# Patient Record
Sex: Female | Born: 1965 | Race: White | Hispanic: No | Marital: Married | Smoking: Former smoker
Health system: Southern US, Community
[De-identification: ages and names within clinical notes are randomized; demographics above are authoritative.]

## PROBLEM LIST (undated history)

## (undated) DIAGNOSIS — B019 Varicella without complication: Secondary | ICD-10-CM

## (undated) DIAGNOSIS — R51 Headache: Secondary | ICD-10-CM

## (undated) DIAGNOSIS — J45909 Unspecified asthma, uncomplicated: Secondary | ICD-10-CM

## (undated) DIAGNOSIS — F32A Depression, unspecified: Secondary | ICD-10-CM

## (undated) DIAGNOSIS — R7611 Nonspecific reaction to tuberculin skin test without active tuberculosis: Secondary | ICD-10-CM

## (undated) DIAGNOSIS — F419 Anxiety disorder, unspecified: Secondary | ICD-10-CM

## (undated) DIAGNOSIS — M1990 Unspecified osteoarthritis, unspecified site: Secondary | ICD-10-CM

## (undated) DIAGNOSIS — Z8489 Family history of other specified conditions: Secondary | ICD-10-CM

## (undated) DIAGNOSIS — J301 Allergic rhinitis due to pollen: Secondary | ICD-10-CM

## (undated) DIAGNOSIS — R519 Headache, unspecified: Secondary | ICD-10-CM

## (undated) HISTORY — DX: Varicella without complication: B01.9

## (undated) HISTORY — DX: Headache: R51

## (undated) HISTORY — PX: REPLACEMENT TOTAL KNEE: SUR1224

## (undated) HISTORY — DX: Allergic rhinitis due to pollen: J30.1

## (undated) HISTORY — DX: Unspecified asthma, uncomplicated: J45.909

## (undated) HISTORY — PX: TOTAL HIP ARTHROPLASTY: SHX124

## (undated) HISTORY — DX: Headache, unspecified: R51.9

---

## 2010-01-08 LAB — HM PAP SMEAR: HM PAP: NORMAL

## 2010-08-02 LAB — HM MAMMOGRAPHY: HM Mammogram: NEGATIVE

## 2014-10-21 ENCOUNTER — Encounter (INDEPENDENT_AMBULATORY_CARE_PROVIDER_SITE_OTHER): Payer: Self-pay

## 2014-10-21 ENCOUNTER — Ambulatory Visit (INDEPENDENT_AMBULATORY_CARE_PROVIDER_SITE_OTHER): Payer: BC Managed Care – PPO | Admitting: Nurse Practitioner

## 2014-10-21 ENCOUNTER — Encounter: Payer: Self-pay | Admitting: Nurse Practitioner

## 2014-10-21 VITALS — BP 106/68 | HR 89 | Temp 98.2°F | Resp 14 | Ht 61.0 in | Wt 202.0 lb

## 2014-10-21 DIAGNOSIS — M25561 Pain in right knee: Secondary | ICD-10-CM

## 2014-10-21 DIAGNOSIS — R2242 Localized swelling, mass and lump, left lower limb: Secondary | ICD-10-CM

## 2014-10-21 DIAGNOSIS — Z7689 Persons encountering health services in other specified circumstances: Secondary | ICD-10-CM

## 2014-10-21 DIAGNOSIS — J4520 Mild intermittent asthma, uncomplicated: Secondary | ICD-10-CM | POA: Diagnosis not present

## 2014-10-21 DIAGNOSIS — R519 Headache, unspecified: Secondary | ICD-10-CM

## 2014-10-21 DIAGNOSIS — Z7189 Other specified counseling: Secondary | ICD-10-CM | POA: Diagnosis not present

## 2014-10-21 DIAGNOSIS — G4733 Obstructive sleep apnea (adult) (pediatric): Secondary | ICD-10-CM

## 2014-10-21 DIAGNOSIS — R51 Headache: Secondary | ICD-10-CM

## 2014-10-21 MED ORDER — ALBUTEROL SULFATE HFA 108 (90 BASE) MCG/ACT IN AERS: 2.0000 | INHALATION_SPRAY | Freq: Four times a day (QID) | RESPIRATORY_TRACT | Status: AC | PRN

## 2014-10-21 NOTE — Progress Notes (Signed)
Subjective:    Patient ID: Joyce Bradford, female    DOB: 11-05-1965, 49 y.o.   MRN: [id number]  HPI  Joyce Bradford is a 49 yo female establishing care and CC of left foot hurting.   1) New pt info:  Immunizations- tdap 2014, pna 2015  Mammogram- 2012 normal   Pap- 2014, normal; would like [place] to handle GYN  Eye Exam- 05/16  Dental Exam- Not UTD  2) Chronic Problems-  Asthma- Has rescue inhaler, needs new one   Frequent HA- Depo- provera thought it might have made it worse, not happening but once-twice a month and last 2-3 days, frontal headaches, constant pain, bending is worse, been about 2 months since last episode   3) Acute Problems-  Right knee pain x 2 weeks, iced, motrin, knee brace, Monday felt improved, gained 20 lbs over 3 years   Left side of foot bump- only hurts on lateral side, painful x 1 month   OSA- [facility]- needs supplies; had one sleep study in 2008-2009; done at [facility]. Has different insurance ([employer] state health plan). Fatigued during the day   Review of Systems  Constitutional: Negative for fever, chills, diaphoresis and fatigue.  Respiratory: Positive for wheezing. Negative for chest tightness and shortness of breath.   Cardiovascular: Negative for chest pain, palpitations and leg swelling.  Gastrointestinal: Negative for nausea, vomiting and diarrhea.  Musculoskeletal: Positive for arthralgias and gait problem.  Skin: Negative for rash.  Neurological: Positive for headaches. Negative for dizziness, weakness and numbness.  Psychiatric/Behavioral: The patient is not nervous/anxious.    Past Medical History  Diagnosis Date  . Asthma   . Chicken pox   . Frequent headaches   . Hay fever     History   Social History  . Marital Status: Married    Spouse Name: N/A  . Number of Children: N/A  . Years of Education: N/A   Occupational History  . Not on file.   Social History Main Topics  . Smoking status: Former Smoker    Quit date:  05/08/1997  . Smokeless tobacco: Not on file  . Alcohol Use: 0.0 oz/week    0 Standard drinks or equivalent per week     Comment: Allergy to sulfites in wine; occasional margarita   . Drug Use: No  . Sexual Activity:    Partners: Male     Comment: Husband   Other Topics Concern  . 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 No pertinent past surgical history.  Family History  Problem Relation Age of Onset  . Rheum arthritis Mother   . Hyperlipidemia Father   . Stroke Maternal Grandmother   . Hypertension Maternal Grandfather   . Diabetes Maternal Grandmother   . Cancer Mother     Non Hodgkins Lymphoma  . Mental illness Maternal Grandmother     Not on File  No current outpatient prescriptions on file prior to visit.   No current facility-administered medications on file prior to visit.      Objective:   Physical Exam  Constitutional: She is oriented  to person, place, and time. She appears well-developed and well-nourished. No distress.  BP 106/68 mmHg  Pulse 89  Temp(Src) 98.2 F (36.8 C)  Resp 14  Ht 5\' 1"  (1.549 m)  Wt 202 lb (91.627 kg)  BMI 38.19 kg/m2  SpO2 97%  LMP 10/16/2014   HENT:  Head: Normocephalic and atraumatic.  Right Ear: External ear normal.  Left Ear: External ear normal.  Eyes: EOM are normal. Pupils are equal, round, and reactive to light. Right eye exhibits no discharge. Left eye exhibits no discharge. No scleral icterus.  Neck: Normal range of motion. Neck supple.  Cardiovascular: Normal rate, regular rhythm, normal heart sounds and intact distal pulses.  Exam reveals no gallop and no friction rub.   No  murmur heard. Pulmonary/Chest: Effort normal and breath sounds normal. No respiratory distress. She has no wheezes. She has no rales. She exhibits no tenderness.  Musculoskeletal: Normal range of motion. She exhibits tenderness. She exhibits no edema.  Left foot lateral bump. Will x-ray (thinking back- believe it is left although nurse wrote right).   Neurological: She is alert and oriented to person, place, and time. No cranial nerve deficit. She exhibits normal muscle tone. Coordination normal.  Skin: Skin is warm and dry. No rash noted. She is not diaphoretic.  Psychiatric: She has a normal mood and affect. Her behavior is normal. Judgment and thought content normal.       Assessment & Plan:

## 2014-10-21 NOTE — Patient Instructions (Signed)
Open till 5 pm, order is good a year (please don't wait that long :)   Right knee- 2-3 weeks watch activity, rest ice motrin   Sleep apnea- we will contact [facility] and find out what we need to do from here and obtain records.

## 2014-10-26 DIAGNOSIS — Z7689 Persons encountering health services in other specified circumstances: Secondary | ICD-10-CM | POA: Insufficient documentation

## 2014-10-26 DIAGNOSIS — R2240 Localized swelling, mass and lump, unspecified lower limb: Secondary | ICD-10-CM | POA: Insufficient documentation

## 2014-10-26 DIAGNOSIS — R519 Headache, unspecified: Secondary | ICD-10-CM | POA: Insufficient documentation

## 2014-10-26 DIAGNOSIS — R51 Headache: Secondary | ICD-10-CM

## 2014-10-26 DIAGNOSIS — M25561 Pain in right knee: Secondary | ICD-10-CM | POA: Insufficient documentation

## 2014-10-26 DIAGNOSIS — G4733 Obstructive sleep apnea (adult) (pediatric): Secondary | ICD-10-CM | POA: Insufficient documentation

## 2014-10-26 DIAGNOSIS — J45909 Unspecified asthma, uncomplicated: Secondary | ICD-10-CM | POA: Insufficient documentation

## 2014-10-26 NOTE — Assessment & Plan Note (Signed)
Improved. Discussed diet and exercise for weight loss and gave low GI diet handout. RICE for a few weeks due to injury. Will obtain x-ray if not improved.

## 2014-10-26 NOTE — Assessment & Plan Note (Signed)
Stable. 2 months since last HA. No complaints today. Will follow

## 2014-10-26 NOTE — Assessment & Plan Note (Signed)
Left foot lateral mass. Could be soft tissue edema, x 1 month, Will obtain x-ray

## 2014-10-26 NOTE — Assessment & Plan Note (Signed)
Pt uses [facility]. Awaiting records. She does not have supplies. Last sleep study unsure of date, but approx 2008-2009. Will follow.

## 2014-10-26 NOTE — Assessment & Plan Note (Signed)
Discussed acute and chronic issues. Reviewed health maintenance measures, PFSHx, and immunizations. Obtain records from previous facility.   

## 2014-10-26 NOTE — Assessment & Plan Note (Signed)
Stable. Needs new inhaler to have on hand. Current one is old and out dated.

## 2015-01-19 ENCOUNTER — Other Ambulatory Visit: Payer: Self-pay | Admitting: Obstetrics and Gynecology

## 2015-01-19 DIAGNOSIS — Z1231 Encounter for screening mammogram for malignant neoplasm of breast: Secondary | ICD-10-CM

## 2015-01-28 ENCOUNTER — Ambulatory Visit
Admission: RE | Admit: 2015-01-28 | Discharge: 2015-01-28 | Disposition: A | Discharge status: Home / Self Care | Payer: BC Managed Care – PPO | Source: Ambulatory Visit | Attending: Obstetrics and Gynecology | Admitting: Obstetrics and Gynecology

## 2015-01-28 DIAGNOSIS — Z1231 Encounter for screening mammogram for malignant neoplasm of breast: Secondary | ICD-10-CM | POA: Diagnosis not present

## 2016-03-16 ENCOUNTER — Other Ambulatory Visit: Payer: Self-pay | Admitting: Internal Medicine

## 2016-03-16 DIAGNOSIS — Z1231 Encounter for screening mammogram for malignant neoplasm of breast: Secondary | ICD-10-CM

## 2016-03-21 ENCOUNTER — Encounter: Payer: Self-pay | Admitting: Radiology

## 2016-03-21 ENCOUNTER — Ambulatory Visit
Admission: RE | Admit: 2016-03-21 | Discharge: 2016-03-21 | Disposition: A | Discharge status: Home / Self Care | Payer: BC Managed Care – PPO | Source: Ambulatory Visit | Attending: Internal Medicine | Admitting: Internal Medicine

## 2016-03-21 DIAGNOSIS — Z1231 Encounter for screening mammogram for malignant neoplasm of breast: Secondary | ICD-10-CM | POA: Diagnosis not present

## 2017-06-12 ENCOUNTER — Other Ambulatory Visit: Payer: Self-pay | Admitting: Internal Medicine

## 2017-06-12 DIAGNOSIS — Z1239 Encounter for other screening for malignant neoplasm of breast: Secondary | ICD-10-CM

## 2020-09-30 ENCOUNTER — Other Ambulatory Visit: Payer: Self-pay | Admitting: Physician Assistant

## 2020-09-30 DIAGNOSIS — Z1231 Encounter for screening mammogram for malignant neoplasm of breast: Secondary | ICD-10-CM

## 2021-06-09 HISTORY — PX: TOTAL HIP ARTHROPLASTY: SHX124

## 2021-10-05 ENCOUNTER — Other Ambulatory Visit: Payer: Self-pay | Admitting: Physician Assistant

## 2021-10-05 DIAGNOSIS — Z1231 Encounter for screening mammogram for malignant neoplasm of breast: Secondary | ICD-10-CM

## 2021-10-07 ENCOUNTER — Ambulatory Visit
Admission: RE | Admit: 2021-10-07 | Discharge: 2021-10-07 | Disposition: A | Discharge status: Home / Self Care | Payer: BC Managed Care – PPO | Source: Ambulatory Visit | Attending: Physician Assistant | Admitting: Physician Assistant

## 2021-10-07 DIAGNOSIS — Z1231 Encounter for screening mammogram for malignant neoplasm of breast: Secondary | ICD-10-CM | POA: Diagnosis not present

## 2021-10-17 ENCOUNTER — Encounter (HOSPITAL_COMMUNITY): Payer: Self-pay | Admitting: Orthopedic Surgery

## 2021-10-17 ENCOUNTER — Other Ambulatory Visit: Payer: Self-pay

## 2021-10-17 NOTE — H&P (Signed)
TOTAL HIP REVISION ADMISSION H&P  Patient is admitted for right revision total hip arthroplasty.  Subjective:  Chief Complaint: right hip pain  HPI: Joyce Bradford, 56 y.o. female, who had undergone right total hip arthroplasty with Dr. Dorthula Nettles 06-09-21.  She had done well postoperatively and had an uneventful postoperative course.  She presented to clinic today to see Dr. Dion Saucier.  She had proximately 2 weeks of atraumatic right thigh and hip pain.  She had no problems with wound healing postoperatively.  She denies any recent illnesses.  She denies any recent trauma or injuries.  She has previously had both knees replaced by Dr. Dion Saucier which are doing well without any issues.  X-rays of the right hip today demonstrated a significant subluxation of the right femoral head and the acetabular component.  Both acetabular shell and femoral head appear well fixed.  Given concern for the significant subluxation, Dr. Dion Saucier discussed revision of arthroplasty with the patient. There is no current active infection.  Patient Active Problem List   Diagnosis Date Noted   Encounter to establish care 10/26/2014   Foot mass 10/26/2014   Right knee pain 10/26/2014   Asthma, chronic 10/26/2014   Frequent headaches 10/26/2014   OSA (obstructive sleep apnea) 10/26/2014   Past Medical History:  Diagnosis Date   Anxiety    Arthritis    Asthma    Chicken pox    Depression    Family history of adverse reaction to anesthesia    Mom hard time waking   Frequent headaches    Hay fever    Positive PPD     Past Surgical History:  Procedure Laterality Date   CESAREAN SECTION     REPLACEMENT TOTAL KNEE Bilateral    TOTAL HIP ARTHROPLASTY      No current facility-administered medications for this encounter.   Current Outpatient Medications  Medication Sig Dispense Refill Last Dose   b complex vitamins capsule Take 1 capsule by mouth daily.      etodolac (LODINE) 400 MG tablet Take 400 mg by mouth 2  (two) times daily.      traMADol (ULTRAM) 50 MG tablet Take 50 mg by mouth 2 (two) times daily.      VITAMIN D, CHOLECALCIFEROL, PO Take by mouth.      albuterol (PROVENTIL HFA;VENTOLIN HFA) 108 (90 BASE) MCG/ACT inhaler Inhale 2 puffs into the lungs every 6 (six) hours as needed for wheezing or shortness of breath. 1 Inhaler 2    Not on File  Social History   Tobacco Use   Smoking status: Former    Types: Cigarettes    Quit date: 05/08/1997    Years since quitting: 24.4   Smokeless tobacco: Not on file  Substance Use Topics   Alcohol use: Yes    Alcohol/week: 0.0 standard drinks of alcohol    Comment: Allergy to sulfites in wine; occasional margarita     Family History  Problem Relation Age of Onset   Rheum arthritis Mother    Cancer Mother        Non Hodgkins Lymphoma   Hyperlipidemia Father    Stroke Maternal Grandmother    Diabetes Maternal Grandmother    Mental illness Maternal Grandmother    Hypertension Maternal Grandfather       Review of Systems  Objective:  Physical Exam  Vital signs in last 24 hours: Weight:  [83 kg] 83 kg (06/12 1143)   Labs:   Estimated body mass index is 33.47 kg/m as  calculated from the following:   Height as of this encounter: 5\' 2"  (1.575 m).   Weight as of this encounter: 83 kg.  Imaging Review:  Plain radiographs reviewed from clinic demonstrate subluxated right femoral head total hip arthroplasty.  Femoral and acetabular components appear to be well fixed.  Acetabular component appears anteverted but relatively vertical.    Assessment/Plan: Right total hip arthroplasty instability  Discussed with the patient over the phone that she has subluxation of her total hip arthroplasty.  She appears to have a well fixed well-positioned femoral component, but acetabular component appears to be relatively vertical, and given the patient's frank instability revision surgery is indicated.  Discussed with the patient intraoperative  assessment to assess stability and component fixation..  Cultures to be sent to rule out infection.  Discussed plan A which would be acetabular component revision only.  The plan would be to extract the patient's current acetabular component and reposition a new acetabular shell to improve the patient's stability.  Discussed that pending the fixation of the acetabular shell and her remaining bone may require a period of partial weightbearing postoperatively.  We will also have offset, lipped liners, dual mobility liners available as well to be utilized depending on the patient's intraoperative stability assessment.  MRI component will plan to be retained unless concern regarding femoral component version or challenges with exposure with the femoral component left in.  Discussed increased risk of infection with revision surgery.  Plan for extended antibiotics postoperatively.  Plan for incisional wound VAC and nylon sutures to facilitate wound healing.   The additional risks and benefits of total hip arthroplasty were presented and reviewed. The risks due to aseptic loosening, infection, stiffness, dislocation/subluxation,  thromboembolic complications and other imponderables were discussed.  The patient acknowledged the explanation, agreed to proceed with the plan and consent was signed. Patient is being admitted for inpatient treatment for surgery, pain control, PT, OT, prophylactic antibiotics, VTE prophylaxis, progressive ambulation and ADL's and discharge planning.  , MD Orthopaedic Surgery

## 2021-10-17 NOTE — Progress Notes (Addendum)
COVID Vaccine Completed:  Yes  Date of COVID positive in last 90 days:  No  PCP - Maurine Minister at [facility] Cardiologist - N/A  Chest x-ray - N/A EKG - at PCP Stress Test - N/A ECHO - N/A Cardiac Cath - N/A Pacemaker/ICD device last checked: Spinal Cord Stimulator:  Bowel Prep - N/A  Sleep Study - Yes, +sleep apnea CPAP - Yes  Fasting Blood Sugar - N/A Checks Blood Sugar _____ times a day  Blood Thinner Instructions: N/A Aspirin Instructions: Last Dose:  Activity level:   Can go up a flight of stairs and perform activities of daily living without stopping and without symptoms of chest pain or shortness of breath.  Some limitations due to hip pain  Anesthesia review: N/A  Patient denies shortness of breath, fever, cough and chest pain at PAT appointment  Patient verbalized understanding of instructions that were given to them at the PAT appointment. Patient was also instructed that they will need to review over the PAT instructions again at home before surgery.

## 2021-10-18 ENCOUNTER — Inpatient Hospital Stay (HOSPITAL_COMMUNITY): Payer: BC Managed Care – PPO

## 2021-10-18 ENCOUNTER — Encounter (HOSPITAL_COMMUNITY): Payer: Self-pay | Admitting: Orthopedic Surgery

## 2021-10-18 ENCOUNTER — Ambulatory Visit (HOSPITAL_COMMUNITY): Payer: BC Managed Care – PPO | Admitting: Certified Registered Nurse Anesthetist

## 2021-10-18 ENCOUNTER — Ambulatory Visit (HOSPITAL_COMMUNITY): Payer: BC Managed Care – PPO

## 2021-10-18 ENCOUNTER — Other Ambulatory Visit: Payer: Self-pay

## 2021-10-18 ENCOUNTER — Observation Stay (HOSPITAL_COMMUNITY)
Admission: RE | Admit: 2021-10-18 | Discharge: 2021-10-20 | Disposition: A | Discharge status: Home / Self Care | Payer: BC Managed Care – PPO | Attending: Orthopedic Surgery | Admitting: Orthopedic Surgery

## 2021-10-18 ENCOUNTER — Encounter (HOSPITAL_COMMUNITY)
Admission: RE | Disposition: A | Discharge status: Home / Self Care | Payer: Self-pay | Source: Home / Self Care | Attending: Orthopedic Surgery

## 2021-10-18 DIAGNOSIS — M25351 Other instability, right hip: Secondary | ICD-10-CM | POA: Diagnosis not present

## 2021-10-18 DIAGNOSIS — Z79899 Other long term (current) drug therapy: Secondary | ICD-10-CM | POA: Diagnosis not present

## 2021-10-18 DIAGNOSIS — Y792 Prosthetic and other implants, materials and accessory orthopedic devices associated with adverse incidents: Secondary | ICD-10-CM | POA: Diagnosis not present

## 2021-10-18 DIAGNOSIS — Z96653 Presence of artificial knee joint, bilateral: Secondary | ICD-10-CM | POA: Diagnosis not present

## 2021-10-18 DIAGNOSIS — Z87891 Personal history of nicotine dependence: Secondary | ICD-10-CM | POA: Insufficient documentation

## 2021-10-18 DIAGNOSIS — J45909 Unspecified asthma, uncomplicated: Secondary | ICD-10-CM | POA: Diagnosis not present

## 2021-10-18 DIAGNOSIS — T84090A Other mechanical complication of internal right hip prosthesis, initial encounter: Secondary | ICD-10-CM | POA: Diagnosis not present

## 2021-10-18 DIAGNOSIS — Z01818 Encounter for other preprocedural examination: Secondary | ICD-10-CM

## 2021-10-18 DIAGNOSIS — Z96641 Presence of right artificial hip joint: Secondary | ICD-10-CM | POA: Diagnosis not present

## 2021-10-18 DIAGNOSIS — M25551 Pain in right hip: Secondary | ICD-10-CM | POA: Diagnosis present

## 2021-10-18 HISTORY — DX: Family history of other specified conditions: Z84.89

## 2021-10-18 HISTORY — DX: Depression, unspecified: F32.A

## 2021-10-18 HISTORY — PX: TOTAL HIP REVISION: SHX763

## 2021-10-18 HISTORY — DX: Nonspecific reaction to tuberculin skin test without active tuberculosis: R76.11

## 2021-10-18 HISTORY — DX: Anxiety disorder, unspecified: F41.9

## 2021-10-18 HISTORY — DX: Unspecified osteoarthritis, unspecified site: M19.90

## 2021-10-18 LAB — TYPE AND SCREEN
ABO/RH(D): A POS
Antibody Screen: NEGATIVE

## 2021-10-18 LAB — SEDIMENTATION RATE: Sed Rate: 45 mm/hr — ABNORMAL HIGH (ref 0–22)

## 2021-10-18 LAB — ABO/RH: ABO/RH(D): A POS

## 2021-10-18 LAB — SURGICAL PCR SCREEN
MRSA, PCR: NEGATIVE
Staphylococcus aureus: POSITIVE — AB

## 2021-10-18 LAB — C-REACTIVE PROTEIN: CRP: 1.7 mg/dL — ABNORMAL HIGH

## 2021-10-18 SURGERY — TOTAL HIP REVISION
Anesthesia: General | Site: Hip | Laterality: Right

## 2021-10-18 MED ORDER — EPHEDRINE 5 MG/ML INJ
INTRAVENOUS | Status: AC
  Filled 2021-10-18: qty 5

## 2021-10-18 MED ORDER — LACTATED RINGERS IV SOLN: INTRAVENOUS | Status: DC

## 2021-10-18 MED ORDER — METHOCARBAMOL 500 MG IVPB - SIMPLE MED
500.0000 mg | Freq: Four times a day (QID) | INTRAVENOUS | Status: DC | PRN
  Administered 2021-10-18: 500 mg via INTRAVENOUS

## 2021-10-18 MED ORDER — CHLORHEXIDINE GLUCONATE 0.12 % MT SOLN
15.0000 mL | Freq: Once | OROMUCOSAL | Status: AC
Start: 2021-10-18 — End: 2021-10-18

## 2021-10-18 MED ORDER — MIDAZOLAM HCL 2 MG/2ML IJ SOLN
INTRAMUSCULAR | Status: AC
  Filled 2021-10-18: qty 2

## 2021-10-18 MED ORDER — DEXAMETHASONE SODIUM PHOSPHATE 10 MG/ML IJ SOLN
INTRAMUSCULAR | Status: AC
  Filled 2021-10-18: qty 1

## 2021-10-18 MED ORDER — KETAMINE HCL 10 MG/ML IJ SOLN
INTRAMUSCULAR | Status: AC
  Filled 2021-10-18: qty 1

## 2021-10-18 MED ORDER — PROPOFOL 10 MG/ML IV BOLUS
INTRAVENOUS | Status: DC | PRN
  Administered 2021-10-18: 170 mg via INTRAVENOUS

## 2021-10-18 MED ORDER — PROPOFOL 1000 MG/100ML IV EMUL
INTRAVENOUS | Status: AC
  Filled 2021-10-18: qty 100

## 2021-10-18 MED ORDER — MIDAZOLAM HCL 5 MG/5ML IJ SOLN
INTRAMUSCULAR | Status: DC | PRN
  Administered 2021-10-18: 2 mg via INTRAVENOUS

## 2021-10-18 MED ORDER — HYDROMORPHONE HCL 1 MG/ML IJ SOLN
INTRAMUSCULAR | Status: AC
  Filled 2021-10-18: qty 1

## 2021-10-18 MED ORDER — ACETAMINOPHEN 325 MG PO TABS
325.0000 mg | ORAL_TABLET | Freq: Four times a day (QID) | ORAL | Status: DC | PRN
  Administered 2021-10-20: 650 mg via ORAL
  Filled 2021-10-18: qty 2

## 2021-10-18 MED ORDER — WATER FOR IRRIGATION, STERILE IR SOLN
Status: DC | PRN
  Administered 2021-10-18: 2000 mL

## 2021-10-18 MED ORDER — SODIUM CHLORIDE (PF) 0.9 % IJ SOLN
INTRAMUSCULAR | Status: AC
  Filled 2021-10-18: qty 50

## 2021-10-18 MED ORDER — PHENOL 1.4 % MT LIQD: 1.0000 | OROMUCOSAL | Status: DC | PRN

## 2021-10-18 MED ORDER — ASPIRIN 81 MG PO CHEW
81.0000 mg | CHEWABLE_TABLET | Freq: Two times a day (BID) | ORAL | Status: DC
  Administered 2021-10-18 – 2021-10-20 (×4): 81 mg via ORAL
  Filled 2021-10-18 (×4): qty 1

## 2021-10-18 MED ORDER — DIPHENHYDRAMINE HCL 12.5 MG/5ML PO ELIX: 12.5000 mg | ORAL_SOLUTION | ORAL | Status: DC | PRN

## 2021-10-18 MED ORDER — ONDANSETRON HCL 4 MG/2ML IJ SOLN
INTRAMUSCULAR | Status: AC
  Filled 2021-10-18: qty 2

## 2021-10-18 MED ORDER — POLYETHYLENE GLYCOL 3350 17 G PO PACK
17.0000 g | PACK | Freq: Every day | ORAL | Status: DC | PRN
  Administered 2021-10-19: 17 g via ORAL
  Filled 2021-10-18: qty 1

## 2021-10-18 MED ORDER — METHOCARBAMOL 500 MG IVPB - SIMPLE MED
INTRAVENOUS | Status: AC
  Filled 2021-10-18: qty 50

## 2021-10-18 MED ORDER — FENTANYL CITRATE (PF) 100 MCG/2ML IJ SOLN
INTRAMUSCULAR | Status: AC
  Filled 2021-10-18: qty 2

## 2021-10-18 MED ORDER — HYDROMORPHONE HCL 1 MG/ML IJ SOLN
INTRAMUSCULAR | Status: DC | PRN
  Administered 2021-10-18: .4 mg via INTRAVENOUS
  Administered 2021-10-18: .6 mg via INTRAVENOUS

## 2021-10-18 MED ORDER — KETAMINE HCL 10 MG/ML IJ SOLN
INTRAMUSCULAR | Status: DC | PRN
  Administered 2021-10-18: 40 mg via INTRAVENOUS

## 2021-10-18 MED ORDER — BUPIVACAINE LIPOSOME 1.3 % IJ SUSP
INTRAMUSCULAR | Status: AC
  Filled 2021-10-18: qty 20

## 2021-10-18 MED ORDER — CELECOXIB 200 MG PO CAPS
400.0000 mg | ORAL_CAPSULE | Freq: Once | ORAL | Status: AC
  Administered 2021-10-18: 400 mg via ORAL
  Filled 2021-10-18: qty 2

## 2021-10-18 MED ORDER — PHENYLEPHRINE 80 MCG/ML (10ML) SYRINGE FOR IV PUSH (FOR BLOOD PRESSURE SUPPORT)
PREFILLED_SYRINGE | INTRAVENOUS | Status: DC | PRN
  Administered 2021-10-18 (×2): 80 ug via INTRAVENOUS

## 2021-10-18 MED ORDER — ONDANSETRON HCL 4 MG/2ML IJ SOLN
INTRAMUSCULAR | Status: DC | PRN
  Administered 2021-10-18: 4 mg via INTRAVENOUS

## 2021-10-18 MED ORDER — SODIUM CHLORIDE 0.9 % IR SOLN
Status: DC | PRN
  Administered 2021-10-18: 3000 mL
  Administered 2021-10-18: 1000 mL

## 2021-10-18 MED ORDER — METHOCARBAMOL 500 MG PO TABS
500.0000 mg | ORAL_TABLET | Freq: Four times a day (QID) | ORAL | Status: DC | PRN
  Administered 2021-10-19 – 2021-10-20 (×4): 500 mg via ORAL
  Filled 2021-10-18 (×4): qty 1

## 2021-10-18 MED ORDER — BUPIVACAINE LIPOSOME 1.3 % IJ SUSP
INTRAMUSCULAR | Status: DC | PRN
  Administered 2021-10-18: 20 mL

## 2021-10-18 MED ORDER — ACETAMINOPHEN 500 MG PO TABS
1000.0000 mg | ORAL_TABLET | Freq: Four times a day (QID) | ORAL | Status: AC
  Administered 2021-10-18 – 2021-10-19 (×4): 1000 mg via ORAL
  Filled 2021-10-18 (×4): qty 2

## 2021-10-18 MED ORDER — DEXAMETHASONE SODIUM PHOSPHATE 10 MG/ML IJ SOLN
8.0000 mg | Freq: Once | INTRAMUSCULAR | Status: AC
  Administered 2021-10-18: 8 mg via INTRAVENOUS

## 2021-10-18 MED ORDER — OXYCODONE HCL 5 MG PO TABS
5.0000 mg | ORAL_TABLET | ORAL | Status: DC | PRN
  Administered 2021-10-18 – 2021-10-20 (×7): 10 mg via ORAL
  Filled 2021-10-18 (×7): qty 2

## 2021-10-18 MED ORDER — SODIUM CHLORIDE (PF) 0.9 % IJ SOLN
INTRAMUSCULAR | Status: DC | PRN
  Administered 2021-10-18: 60 mL

## 2021-10-18 MED ORDER — OXYCODONE HCL 5 MG/5ML PO SOLN: 5.0000 mg | Freq: Once | ORAL | Status: DC | PRN

## 2021-10-18 MED ORDER — PROPOFOL 10 MG/ML IV BOLUS
INTRAVENOUS | Status: AC
  Filled 2021-10-18: qty 20

## 2021-10-18 MED ORDER — ONDANSETRON HCL 4 MG/2ML IJ SOLN: 4.0000 mg | Freq: Four times a day (QID) | INTRAMUSCULAR | Status: DC | PRN

## 2021-10-18 MED ORDER — HYDROMORPHONE HCL 1 MG/ML IJ SOLN
0.5000 mg | INTRAMUSCULAR | Status: DC | PRN
  Administered 2021-10-20: 1 mg via INTRAVENOUS
  Filled 2021-10-18: qty 1

## 2021-10-18 MED ORDER — 0.9 % SODIUM CHLORIDE (POUR BTL) OPTIME
TOPICAL | Status: DC | PRN
  Administered 2021-10-18: 1000 mL

## 2021-10-18 MED ORDER — CEFAZOLIN SODIUM-DEXTROSE 2-4 GM/100ML-% IV SOLN
2.0000 g | Freq: Three times a day (TID) | INTRAVENOUS | Status: DC
  Administered 2021-10-18 – 2021-10-20 (×5): 2 g via INTRAVENOUS
  Filled 2021-10-18 (×5): qty 100

## 2021-10-18 MED ORDER — HYDROMORPHONE HCL 2 MG/ML IJ SOLN
INTRAMUSCULAR | Status: AC
  Filled 2021-10-18: qty 1

## 2021-10-18 MED ORDER — ONDANSETRON HCL 4 MG/2ML IJ SOLN: 4.0000 mg | Freq: Once | INTRAMUSCULAR | Status: DC | PRN

## 2021-10-18 MED ORDER — FENTANYL CITRATE (PF) 100 MCG/2ML IJ SOLN
INTRAMUSCULAR | Status: DC | PRN
Start: 2021-10-18 — End: 2021-10-18
  Administered 2021-10-18 (×4): 50 ug via INTRAVENOUS

## 2021-10-18 MED ORDER — SUGAMMADEX SODIUM 200 MG/2ML IV SOLN
INTRAVENOUS | Status: DC | PRN
  Administered 2021-10-18: 320 mg via INTRAVENOUS

## 2021-10-18 MED ORDER — KETOROLAC TROMETHAMINE 15 MG/ML IJ SOLN
15.0000 mg | Freq: Four times a day (QID) | INTRAMUSCULAR | Status: AC
  Administered 2021-10-18 – 2021-10-19 (×4): 15 mg via INTRAVENOUS
  Filled 2021-10-18 (×4): qty 1

## 2021-10-18 MED ORDER — ISOPROPYL ALCOHOL 70 % SOLN
Status: AC
  Filled 2021-10-18: qty 480

## 2021-10-18 MED ORDER — ONDANSETRON HCL 4 MG PO TABS: 4.0000 mg | ORAL_TABLET | Freq: Four times a day (QID) | ORAL | Status: DC | PRN

## 2021-10-18 MED ORDER — ORAL CARE MOUTH RINSE
15.0000 mL | Freq: Once | OROMUCOSAL | Status: AC
Start: 2021-10-18 — End: 2021-10-18
  Administered 2021-10-18: 15 mL via OROMUCOSAL

## 2021-10-18 MED ORDER — MENTHOL 3 MG MT LOZG: 1.0000 | LOZENGE | OROMUCOSAL | Status: DC | PRN

## 2021-10-18 MED ORDER — ACETAMINOPHEN 500 MG PO TABS
1000.0000 mg | ORAL_TABLET | Freq: Once | ORAL | Status: AC
  Administered 2021-10-18: 1000 mg via ORAL
  Filled 2021-10-18: qty 2

## 2021-10-18 MED ORDER — CHLORHEXIDINE GLUCONATE CLOTH 2 % EX PADS
6.0000 | MEDICATED_PAD | Freq: Every day | CUTANEOUS | Status: DC
  Administered 2021-10-19: 6 via TOPICAL

## 2021-10-18 MED ORDER — SENNA 8.6 MG PO TABS
1.0000 | ORAL_TABLET | Freq: Two times a day (BID) | ORAL | Status: DC
  Administered 2021-10-18 – 2021-10-20 (×4): 8.6 mg via ORAL
  Filled 2021-10-18 (×4): qty 1

## 2021-10-18 MED ORDER — OXYCODONE HCL 5 MG PO TABS: 5.0000 mg | ORAL_TABLET | Freq: Once | ORAL | Status: DC | PRN

## 2021-10-18 MED ORDER — DOCUSATE SODIUM 100 MG PO CAPS
100.0000 mg | ORAL_CAPSULE | Freq: Two times a day (BID) | ORAL | Status: DC
  Administered 2021-10-18 – 2021-10-20 (×4): 100 mg via ORAL
  Filled 2021-10-18 (×4): qty 1

## 2021-10-18 MED ORDER — MUPIROCIN 2 % EX OINT
1.0000 "application " | TOPICAL_OINTMENT | Freq: Two times a day (BID) | CUTANEOUS | Status: DC
  Administered 2021-10-18 – 2021-10-20 (×4): 1 via NASAL
  Filled 2021-10-18: qty 22

## 2021-10-18 MED ORDER — EPHEDRINE SULFATE-NACL 50-0.9 MG/10ML-% IV SOSY
PREFILLED_SYRINGE | INTRAVENOUS | Status: DC | PRN
  Administered 2021-10-18: 10 mg via INTRAVENOUS

## 2021-10-18 MED ORDER — HYDROMORPHONE HCL 1 MG/ML IJ SOLN
0.2500 mg | INTRAMUSCULAR | Status: DC | PRN
  Administered 2021-10-18 (×4): 0.5 mg via INTRAVENOUS

## 2021-10-18 MED ORDER — CEFAZOLIN SODIUM-DEXTROSE 2-4 GM/100ML-% IV SOLN
2.0000 g | INTRAVENOUS | Status: AC
  Administered 2021-10-18: 2 g via INTRAVENOUS
  Filled 2021-10-18: qty 100

## 2021-10-18 MED ORDER — PANTOPRAZOLE SODIUM 40 MG PO TBEC
40.0000 mg | DELAYED_RELEASE_TABLET | Freq: Every day | ORAL | Status: DC
  Administered 2021-10-18 – 2021-10-20 (×3): 40 mg via ORAL
  Filled 2021-10-18 (×3): qty 1

## 2021-10-18 MED ORDER — ZOLPIDEM TARTRATE 5 MG PO TABS: 5.0000 mg | ORAL_TABLET | Freq: Every evening | ORAL | Status: DC | PRN

## 2021-10-18 MED ORDER — ROCURONIUM BROMIDE 10 MG/ML (PF) SYRINGE
PREFILLED_SYRINGE | INTRAVENOUS | Status: DC | PRN
  Administered 2021-10-18: 70 mg via INTRAVENOUS

## 2021-10-18 MED ORDER — LIDOCAINE 2% (20 MG/ML) 5 ML SYRINGE
INTRAMUSCULAR | Status: DC | PRN
  Administered 2021-10-18: 80 mg via INTRAVENOUS

## 2021-10-18 MED ORDER — SODIUM CHLORIDE (PF) 0.9 % IJ SOLN
INTRAMUSCULAR | Status: AC
  Filled 2021-10-18: qty 10

## 2021-10-18 MED ORDER — TRANEXAMIC ACID-NACL 1000-0.7 MG/100ML-% IV SOLN
1000.0000 mg | INTRAVENOUS | Status: AC
  Administered 2021-10-18: 1000 mg via INTRAVENOUS
  Filled 2021-10-18: qty 100

## 2021-10-18 SURGICAL SUPPLY — 78 items
BAG COUNTER SPONGE SURGICOUNT (BAG) ×1 IMPLANT
BAG DECANTER FOR FLEXI CONT (MISCELLANEOUS) ×2 IMPLANT
BAG ZIPLOCK 12X15 (MISCELLANEOUS) ×2 IMPLANT
BLADE SAW SAG 25X90X1.19 (BLADE) IMPLANT
BLADE SAW SGTL 81X20 HD (BLADE) ×2 IMPLANT
CANISTER WOUND CARE 500ML ATS (WOUND CARE) ×1 IMPLANT
CHLORAPREP W/TINT 26 (MISCELLANEOUS) ×4 IMPLANT
COVER SURGICAL LIGHT HANDLE (MISCELLANEOUS) ×2 IMPLANT
DERMABOND ADVANCED (GAUZE/BANDAGES/DRESSINGS) ×1
DERMABOND ADVANCED .7 DNX12 (GAUZE/BANDAGES/DRESSINGS) ×1 IMPLANT
DRAPE 3/4 80X56 (DRAPES) ×4 IMPLANT
DRAPE C-ARM 42X120 X-RAY (DRAPES) ×2 IMPLANT
DRAPE C-ARMOR (DRAPES) ×1 IMPLANT
DRAPE HIP W/POCKET STRL (MISCELLANEOUS) ×2 IMPLANT
DRAPE INCISE IOBAN 66X45 STRL (DRAPES) ×2 IMPLANT
DRAPE INCISE IOBAN 85X60 (DRAPES) ×2 IMPLANT
DRAPE POUCH INSTRU U-SHP 10X18 (DRAPES) ×2 IMPLANT
DRAPE SURG 17X11 SM STRL (DRAPES) ×2 IMPLANT
DRAPE U-SHAPE 47X51 STRL (DRAPES) ×2 IMPLANT
DRESSING AQUACEL AG SP 3.5X10 (GAUZE/BANDAGES/DRESSINGS) ×1 IMPLANT
DRESSING PEEL AND PLAC PRVNA20 (GAUZE/BANDAGES/DRESSINGS) IMPLANT
DRSG AQUACEL AG SP 3.5X10 (GAUZE/BANDAGES/DRESSINGS) ×2
DRSG PEEL AND PLACE PREVENA 20 (GAUZE/BANDAGES/DRESSINGS) ×2
ECCENTRIC TRIDENT X3 0DEG 36E (Liner) IMPLANT
ELECT BLADE TIP CTD 4 INCH (ELECTRODE) ×2 IMPLANT
ELECT NDL TIP 2.8 STRL (NEEDLE) ×1 IMPLANT
ELECT NEEDLE TIP 2.8 STRL (NEEDLE) ×2 IMPLANT
ELECT REM PT RETURN 15FT ADLT (MISCELLANEOUS) ×2 IMPLANT
GLOVE BIOGEL PI IND STRL 8 (GLOVE) ×2 IMPLANT
GLOVE BIOGEL PI INDICATOR 8 (GLOVE) ×2
GLOVE SURG LX 8.0 MICRO (GLOVE) ×2
GLOVE SURG LX STRL 8.0 MICRO (GLOVE) ×2 IMPLANT
GLOVE SURG ORTHO 8.0 STRL STRW (GLOVE) ×2 IMPLANT
GOWN STRL REUS W/ TWL XL LVL3 (GOWN DISPOSABLE) ×1 IMPLANT
GOWN STRL REUS W/TWL XL LVL3 (GOWN DISPOSABLE) ×1
HANDPIECE INTERPULSE COAX TIP (DISPOSABLE)
HEAD FEM DLT TS CER 36X+5.0 (Head) ×1 IMPLANT
HOLDER FOLEY CATH W/STRAP (MISCELLANEOUS) ×2 IMPLANT
HOOD PEEL AWAY FLYTE STAYCOOL (MISCELLANEOUS) ×6 IMPLANT
KIT BASIN OR (CUSTOM PROCEDURE TRAY) ×2 IMPLANT
KIT DRSG PREVENA PLUS 7DAY 125 (MISCELLANEOUS) ×1 IMPLANT
KIT TURNOVER KIT A (KITS) ×1 IMPLANT
MANIFOLD NEPTUNE II (INSTRUMENTS) ×2 IMPLANT
MARKER SKIN DUAL TIP RULER LAB (MISCELLANEOUS) ×2 IMPLANT
NDL SAFETY ECLIPSE 18X1.5 (NEEDLE) IMPLANT
NEEDLE HYPO 18GX1.5 SHARP (NEEDLE) ×2
NEEDLE HYPO 22GX1.5 SAFETY (NEEDLE) IMPLANT
NS IRRIG 1000ML POUR BTL (IV SOLUTION) ×2 IMPLANT
PACK TOTAL JOINT (CUSTOM PROCEDURE TRAY) ×2 IMPLANT
PROTECTOR NERVE ULNAR (MISCELLANEOUS) ×2 IMPLANT
RETRIEVER SUT HEWSON (MISCELLANEOUS) ×2 IMPLANT
SCREW HEX LP 6.5X30 (Screw) ×1 IMPLANT
SCREW HEX LP 6.5X35 (Screw) ×1 IMPLANT
SEALER BIPOLAR AQUA 6.0 (INSTRUMENTS) ×2 IMPLANT
SET HNDPC FAN SPRY TIP SCT (DISPOSABLE) IMPLANT
SHELL MULTIHOLE ACETABULAR 52E (Miscellaneous) ×1 IMPLANT
SOLUTION IRRIG SURGIPHOR (IV SOLUTION) ×2 IMPLANT
SPIKE FLUID TRANSFER (MISCELLANEOUS) ×6 IMPLANT
SUCTION FRAZIER HANDLE 12FR (TUBING) ×1
SUCTION TUBE FRAZIER 12FR DISP (TUBING) ×1 IMPLANT
SUT BONE WAX W31G (SUTURE) ×2 IMPLANT
SUT ETHIBOND #5 BRAIDED 30INL (SUTURE) ×2 IMPLANT
SUT ETHILON 3 0 PS 1 (SUTURE) ×4 IMPLANT
SUT MNCRL AB 3-0 PS2 18 (SUTURE) ×2 IMPLANT
SUT STRATAFIX 0 PDS 27 VIOLET (SUTURE) ×4
SUT STRATAFIX PDO 1 14 VIOLET (SUTURE) ×2
SUT STRATFX PDO 1 14 VIOLET (SUTURE) ×2
SUT VIC AB 1 CT1 36 (SUTURE) ×3 IMPLANT
SUT VIC AB 1 CTX 18 (SUTURE) ×1 IMPLANT
SUT VIC AB 2-0 CT2 27 (SUTURE) ×4 IMPLANT
SUTURE STRATFX 0 PDS 27 VIOLET (SUTURE) ×1 IMPLANT
SUTURE STRATFX PDO 1 14 VIOLET (SUTURE) ×1 IMPLANT
SYR 20ML LL LF (SYRINGE) ×4 IMPLANT
TOWEL OR 17X26 10 PK STRL BLUE (TOWEL DISPOSABLE) ×2 IMPLANT
TRAY FOLEY MTR SLVR 16FR STAT (SET/KITS/TRAYS/PACK) ×2 IMPLANT
TRIDENT X3 0DEG ECCENTRIC 36E (Liner) ×2 IMPLANT
UNDERPAD 30X36 HEAVY ABSORB (UNDERPADS AND DIAPERS) ×2 IMPLANT
WATER STERILE IRR 1000ML POUR (IV SOLUTION) ×4 IMPLANT

## 2021-10-18 NOTE — Transfer of Care (Signed)
Immediate Anesthesia Transfer of Care Note  Patient: Joyce Bradford  Procedure(s) Performed: TOTAL HIP REVISION (Right: Hip)  Patient Location: PACU  Anesthesia Type:General  Level of Consciousness: awake, alert  and oriented  Airway & Oxygen Therapy: Patient Spontanous Breathing and Patient connected to face mask oxygen  Post-op Assessment: Report given to RN, Post -op Vital signs reviewed and stable and Patient moving all extremities X 4  Post vital signs: Reviewed and stable  Last Vitals:  Vitals Value Taken Time  BP 146/74 10/18/21 1652  Temp    Pulse 83 10/18/21 1654  Resp 13 10/18/21 1654  SpO2 100 % 10/18/21 1654  Vitals shown include unvalidated device data.  Last Pain:  Vitals:   10/18/21 1038  TempSrc:   PainSc: 6          Complications: No notable events documented.

## 2021-10-18 NOTE — Interval H&P Note (Signed)
The patient has been re-examined, and the chart reviewed, and there have been no interval changes to the documented history and physical.    Plan for revision right total hip arthroplasty for instability.  Discussed with the patient and various options.  Plan a will be acetabular component revision only.  Plan B if difficult access will have to explant the stem and put in a revision femoral stem as well.  Plan C given elevated ESR preoperatively if concerns for infection intraoperatively we will plan for an a spacer and plan for replant hip replacement in 2 to 3 months.  Finally discussed if continued instability even after revision surgery today patient may ultimately require a constrained liner.  Would need ingrowth of the components before a constrained liner is placed likely 6 months after index surgery.  The operative side was examined and the patient was confirmed to have.  Well-healed posterior lateral incision without erythema. Sens DPN, SPN, TN intact, Motor EHL, ext, flex 5/5, and DP 2+, PT 2+, No significant edema.   The risks, benefits, and alternatives have been discussed at length with patient, and the patient is willing to proceed.  Right hip marked. Consent has been signed.

## 2021-10-18 NOTE — Anesthesia Postprocedure Evaluation (Signed)
Anesthesia Post Note  Patient: Joyce Bradford  Procedure(s) Performed: TOTAL HIP REVISION (Right: Hip)     Patient location during evaluation: PACU Anesthesia Type: General Level of consciousness: sedated, patient cooperative and oriented Pain management: pain level controlled Vital Signs Assessment: post-procedure vital signs reviewed and stable Respiratory status: spontaneous breathing, nonlabored ventilation, respiratory function stable and patient connected to nasal cannula oxygen Cardiovascular status: blood pressure returned to baseline and stable Postop Assessment: no apparent nausea or vomiting Anesthetic complications: no   No notable events documented.  Last Vitals:  Vitals:   10/18/21 1715 10/18/21 1730  BP: (!) 146/76 133/78  Pulse: (!) 56 70  Resp: 10 10  Temp: (!) 36.4 C   SpO2: 100% 100%    Last Pain:  Vitals:   10/18/21 1730  TempSrc:   PainSc: Asleep                 [name],E. Refujio Haymer

## 2021-10-18 NOTE — Progress Notes (Signed)
     Subjective:  Patient reports pain as mild.  Resting comfortably in PACU. Denies distal n/t. No issues.  Objective:   VITALS:   Vitals:   10/18/21 1730 10/18/21 1745 10/18/21 1800 10/18/21 1821  BP: 133/78 121/65 128/74 (!) 145/83  Pulse: 70 (!) 57 73 77  Resp: _0 Temp:   (!) 97.5 F (36.4 C) (!) 97.4 F (36.3 C)  TempSrc:      SpO2: 100% 100% 100% 100%  Weight:      Height:        Sensation intact distally Intact pulses distally Dorsiflexion/Plantar flexion intact Incision: prevena vac dressing holding suction no output in cannister Compartment soft   No results found for: "WBC", "HGB", "HCT", "MCV", "PLT" BMET No results found for: "NA", "K", "CL", "CO2", "GLUCOSE", "BUN", "CREATININE", "CALCIUM", "EGFR", "GFRNONAA"    Xray: post op xrays R hip demonstrate THA components in good position without adverse features, cortically irregularity on anterior femoral cortex not concerning for fracture  Assessment/Plan: Day of Surgery   Principal Problem:   Hip instability, right  S/p R revision THA for anterior instability 6/13  Post op recs: WB: WBAT RLE, posterior and anterior hip precautions x6 weeks Abx: ancef while in house, discharge with 7 days cefadroxil 500 twice daily, follow-up Intra-Op cultures. Imaging: PACU pelvis Xray Dressing: Prevena wound VAC to stay on for 1 week. DVT prophylaxis: Aspirin 81BID starting POD1 Follow up: 1 week after surgery for a wound check with Dr. Zachery Dakins at [facility].  Address: [address] [place], [place], [place] [address]  Office Phone: [phone]   Willaim Sheng 10/18/2021, 7:14 PM   Charlies Constable, MD  Contact information:   [phone] 7am-5pm epic message Dr. Zachery Dakins, or call office for patient follow up: [phone] After hours and holidays please check [email or web address] for group call information for [facility]

## 2021-10-18 NOTE — OR Nursing (Signed)
Per Dr Blanchie Dessert right hip 50 sector cup, 32x50 liner, 22mm screw, hole eliminator, 32+1 ceramic ball

## 2021-10-18 NOTE — Op Note (Signed)
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 female who had undergone primary total hip arthroplasty with Dr. Mardelle Matte on 06/09/2021 via posterior approach.  She had an uneventful postoperative course.  She presented yesterday to see Dr. Mardelle Matte in clinic with 2 weeks of progressively worsening right leg and hip pain.  X-rays demonstrated subluxation of her total hip arthroplasty.  Given concern for frank instability we had discussed urgent revision surgery to address her instability.  The risks benefits and alternatives were discussed with the patient including but not limited to the risks of nonoperative treatment, versus surgical intervention including infection, bleeding, nerve injury, periprosthetic fracture, the need for revision surgery, dislocation, leg length discrepancy, blood clots, cardiopulmonary complications, morbidity, mortality, among others, and they were willing to proceed.  We discussed notably risks of infection which are higher after revision in the subacute period, increased risk of instability given her prior history of instability, possibility of limb neck discrepancy as added length but will likely be necessary to help stabilize her hip arthroplasty to reduce her risk for dislocation.  Discussed also possibility of a constrained liner if continues to have instability after revision.   OPERATIVE REPORT     SURGEON:  Charlies Constable, MD    ASSISTANT: Carter Kitten, MD, Merlene Pulling, PA-C, (Present throughout the entire procedure,  necessary for completion of procedure in a timely manner, assisting with retraction, instrumentation, and closure)      ANESTHESIA: General  ESTIMATED BLOOD LOSS: 025EN    COMPLICATIONS:  None.     UNIQUE ASPECTS OF THE CASE: Patient notably had anterior instability.  Prior to revision intraoperative assessment demonstrated subluxation anteriorly with any hip extension past neutral, or hip extension with any external rotation past neutral.  Notably good posterior stability.  Following cup revision the patient had excellent posterior and anterior stability with full extension and external rotation 90 degrees without impingement, hip flexion to 90 degrees and internal rotation to 70 degrees without posterior subluxation.  COMPONENTS:   Stryker 52 mm multihole acetabular shell, 6 mm offset polyethylene liner, 36+5 mm ceramic head with titanium sleeve, 6.5 hex screws x230 and 35 mm Implant Name Type Inv. Item Serial No. Manufacturer Lot No. LRB No. Used Action  SHELL MULTIHOLE ACETABULAR 52E - [id number] Miscellaneous SHELL MULTIHOLE ACETABULAR 52E  [employer] [id number] Right 1 Implanted  SCREW HEX LP 6.5X30 - [id number] Screw SCREW HEX LP 6.5X30  [employer] [id number] Right 1 Implanted  SCREW HEX LP 6.5X35 - [id number] Screw SCREW HEX LP 6.5X35  [employer] [id number] Right 1 Implanted  TRIDENT X3 0DEG ECCENTRIC 36E - [id number] Liner TRIDENT X3 0DEG ECCENTRIC 36E  [employer] [id number] Right 1 Implanted  HEAD FEM DLT TS CER 36X+5.0 - [id number] Head HEAD FEM DLT TS CER 36X+5.0  [employer] [id number] Right 1 Implanted      PROCEDURE IN DETAIL:   The patient was met in the holding area and  identified.  The appropriate hip was identified and marked at the operative site.  The patient was then transported to the OR  and  placed under anesthesia.  At that point, the patient was  placed in the lateral decubitus position with the operative side up and  secured to the operating room table  and all bony prominences padded. A subaxillary role was also placed.    The operative lower extremity was  prepped from the iliac crest to the distal leg.  Sterile draping was performed.  Time out was performed prior to incision.      A routine posterolateral approach was utilized via sharp dissection utilizing the patient's old extended incision extended about 1 inch at each end carried down to the subcutaneous tissue.  Gross bleeders were Bovie coagulated.  The iliotibial band was identified and incised along the length of the skin incision through the glute max fascia.  Charnley retractor was placed with care to protect the sciatic nerve posteriorly.  With the hip internally rotated, and capsulotomy was performed off the femoral insertion and  tagged with a #5 Ethibond.  Upon capsulotomy there was notable serous benign-appearing joint fluid.  No concerns for infection encountered during the approach.  Stability assessment was performed at this point. Assessment demonstrated subluxation anteriorly with any hip extension past neutral, or hip extension with any external rotation past neutral.  Notably good posterior stability.  With no subluxation or dislocation at 90 degrees flexion and 90 degrees internal rotation.  The hip was then dislocated and the old ceramic head was removed.  It was a 32+1 mm ceramic head.  There is no damage to the trunnion.  Version of the existing femoral stem was assessed to be about 20 degrees.     I then exposed the acetabulum.  After adequate visualization, the cup position was assessed and determined to have about 45 degrees of anteversion and relatively vertical, approximately 80 degrees.  The old acetabular component was exposed circumferentially.  Excess capsule was excised circumferentially.  2 intra-articular synovial specimens were sent for aerobic anaerobic culture.  Once adequate circumferential exposure was obtained the old liner was removed.  This was a 4 mm offset liner.  The single screw was removed.  Using a 50 mm curved acetabular extraction osteotome worked around  the cup circumferentially.  The cup was then easily removed.  Notably very little bone loss was obtained on the acetabular component.  Based on preoperative imaging I felt there was significant room to medialize a new acetabular component.  The patient had excellent column and wall both anterior posteriorly after acetabular explant.  I then started reaming with a 50 mm reamer, first medializing to the floor of the cotyloid fossa, and then in the position of the cup aiming towards the greater sciatic notch, matching the version of the transverse acetabular ligament and tucked under the anterior wall. I reamed up to 52 mm reamer with good bony bed preparation and a 52 mm multihole cup was chosen.  The real cup was then impacted into place.  Appropriate version and inclination was confirmed clinically matching their bony anatomy, and also with the use of the jig.  The new acetabular component was placed at about 40 degrees of abduction and 25 degrees of anteversion.  The cup had excellent press-fit in the socket.  I placed 2 screws in the posterior superior quadrant to augment fixation.  Fluoroscopy was then used to confirm appropriate Position and adequate medialization.  A neutral trial liner was then placed.  A 36+1 mm femoral head trial was then placed on the hip was reduced.  A trial broach, neck, and head was utilized, and I reduced the hip and it was found to have excellent stability.  There was no impingement with full  extension and 90 degrees external rotation.  The hip was stable at the position of sleep and with 90 deg rees flexion and 50 degrees of internal rotation.  Leg lengths were also clinically assessed in the lateral position and the right leg was felt to be near equal possibly slightly short.  Felt this could be corrected with an offset liner plus increased head ball.  A 6 mm offset liner was placed and impacted. It was confirmed to be appropriately seated and the acetabular retractors were  removed.  I again trialed and selected a 36 + 10m ball.  The hip was then reduced and taken through a range of motion. There was no impingement with full extension and 90 degrees external rotation.  The hip was stable at the position of sleep and with 90 degrees flexion and 70 degrees of internal rotation. Leg lengths were  again assessed and felt to be restored.  A real 36+5 mm revision ceramic head ball with titanium sleeve was impacted into place, and the hip was reduced.  Leg lengths were again assessed and felt to be restored.  The posterior capsule was then closed with #5 Ethibond.  The piriformis was repaired through the base of the abductor tendon using a [place] suture passer.  I then irrigated the hip copiously again with surgery for Betadine irrigation and with pulse lavage. Periarticular injection was then performed with Exparel.  Final fluoroscopic images was obtained and components were confirmed to be in good position without fracture. We repaired the fascia with #1 Vicryl and running #1 barbed suture, followed by 0 barbed suture for the subcutaneous fat.  Skin was closed with 2-0 Vicryl and 3-0 Nylon.  Wound VAC dressing was applied.. The patient was then awakened and returned to PACU in stable and satisfactory condition.  Leg lengths in the supine position were assessed and felt to be clinically equal. There were no complications.  Post op recs: WB: WBAT RLE, posterior and anterior hip precautions x6 weeks Abx: ancef while in house, discharge with 7 days cefadroxil 500 twice daily, follow-up Intra-Op cultures. Imaging: PACU pelvis Xray Dressing: Prevena wound VAC to stay on for 1 week. DVT prophylaxis: Aspirin 81BID starting POD1 Follow up: 1 week after surgery for a wound check [facility]  [address] [place] [address] Office Phone: [phone]  DCharlies Constable MD Orthopedic Surgeon

## 2021-10-18 NOTE — Anesthesia Procedure Notes (Addendum)
Procedure Name: Intubation Date/Time: 10/18/2021 1:28 PM  Performed by: West Pugh, CRNAPre-anesthesia Checklist: Patient identified, Emergency Drugs available, Suction available, Patient being monitored and Timeout performed Patient Re-evaluated:Patient Re-evaluated prior to induction Oxygen Delivery Method: Circle system utilized Preoxygenation: Pre-oxygenation with 100% oxygen Induction Type: IV induction Ventilation: Mask ventilation without difficulty and Mask ventilation throughout procedure Laryngoscope Size: Mac and 3 Tube type: Oral Tube size: 7.0 mm Number of attempts: 2 Airway Equipment and Method: Stylet Placement Confirmation: ETT inserted through vocal cords under direct vision, positive ETCO2, CO2 detector and breath sounds checked- equal and bilateral Secured at: 22 cm Tube secured with: Tape Dental Injury: Teeth and Oropharynx as per pre-operative assessment  Comments: Grade 3 view by CRNA unable to pass tube x 1 attempt. Dr Gifford Shave with MAC 3 DL x 1 attempt and successful.

## 2021-10-18 NOTE — Anesthesia Preprocedure Evaluation (Signed)
Anesthesia Evaluation  Patient identified by MRN, date of birth, ID band Patient awake    Reviewed: Allergy & Precautions, NPO status , Patient's Chart, lab work & pertinent test results  Airway Mallampati: II  TM Distance: >3 FB Neck ROM: Full    Dental no notable dental hx.    Pulmonary sleep apnea , former smoker,    Pulmonary exam normal breath sounds clear to auscultation       Cardiovascular negative cardio ROS Normal cardiovascular exam Rhythm:Regular Rate:Normal     Neuro/Psych negative neurological ROS  negative psych ROS   GI/Hepatic negative GI ROS, Neg liver ROS,   Endo/Other  negative endocrine ROS  Renal/GU negative Renal ROS  negative genitourinary   Musculoskeletal negative musculoskeletal ROS (+)   Abdominal   Peds negative pediatric ROS (+)  Hematology negative hematology ROS (+)   Anesthesia Other Findings   Reproductive/Obstetrics negative OB ROS                             Anesthesia Physical Anesthesia Plan  ASA: 2  Anesthesia Plan: General   Post-op Pain Management:    Induction: Intravenous  PONV Risk Score and Plan: 3 and Ondansetron, Dexamethasone and Treatment may vary due to age or medical condition  Airway Management Planned: Oral ETT  Additional Equipment:   Intra-op Plan:   Post-operative Plan: Extubation in OR  Informed Consent: I have reviewed the patients History and Physical, chart, labs and discussed the procedure including the risks, benefits and alternatives for the proposed anesthesia with the patient or authorized representative who has indicated his/her understanding and acceptance.     Dental advisory given  Plan Discussed with: CRNA and Surgeon  Anesthesia Plan Comments:         Anesthesia Quick Evaluation

## 2021-10-18 NOTE — Progress Notes (Signed)
Patient seen and examined, prosthetic right hip dislocation 4 months postoperatively.  Her wounds look clean, neurologically she is intact distally.  Plan for revision right hip replacement today with myself and Dr. Blanchie Dessert.    The risks benefits and alternatives were discussed with the patient including but not limited to the risks of nonoperative treatment, versus surgical intervention including infection, bleeding, nerve injury, periprosthetic fracture, the need for revision surgery, dislocation, leg length discrepancy, blood clots, cardiopulmonary complications, morbidity, mortality, among others, and they were willing to proceed.    Eulas Post, MD

## 2021-10-19 DIAGNOSIS — T84090A Other mechanical complication of internal right hip prosthesis, initial encounter: Secondary | ICD-10-CM | POA: Diagnosis not present

## 2021-10-19 LAB — BASIC METABOLIC PANEL
Anion gap: 9 (ref 5–15)
BUN: 14 mg/dL (ref 6–20)
CO2: 26 mmol/L (ref 22–32)
Calcium: 9.4 mg/dL (ref 8.9–10.3)
Chloride: 105 mmol/L (ref 98–111)
Creatinine, Ser: 0.76 mg/dL (ref 0.44–1.00)
GFR, Estimated: >60 mL/min (ref >60)
Glucose, Bld: 129 mg/dL — ABNORMAL HIGH (ref 70–99)
Potassium: 4 mmol/L (ref 3.5–5.1)
Sodium: 140 mmol/L (ref 135–145)

## 2021-10-19 LAB — CBC
HCT: 34.4 % — ABNORMAL LOW (ref 36.0–46.0)
Hemoglobin: 11.1 g/dL — ABNORMAL LOW (ref 12.0–15.0)
MCH: 28 pg (ref 26.0–34.0)
MCHC: 32.3 g/dL (ref 30.0–36.0)
MCV: 86.6 fL (ref 80.0–100.0)
Platelets: 251 10*3/uL (ref 150–400)
RBC: 3.97 MIL/uL (ref 3.87–5.11)
RDW: 13.2 % (ref 11.5–15.5)
WBC: 11.5 10*3/uL — ABNORMAL HIGH (ref 4.0–10.5)
nRBC: 0 % (ref 0.0–0.2)

## 2021-10-19 NOTE — Evaluation (Signed)
Physical Therapy Evaluation Patient Details Name: Joyce Bradford MRN: [id number] DOB: 07-Jun-1965 Today's Date: 10/19/2021  History of Present Illness  Patient is 56 y.o. female s/p Rt revision of THA for anterior hip instability on 10/18/21. Pt underwent initial Rt THA on 06/09/21 PMH significant for anxiety, depression, OA, asthma, Bil TKA.   Clinical Impression  Joyce Bradford is a 56 y.o. female POD 1 s/p Rt THA revision. Patient reports independence PTA  with mobility and gradual decline in last 1-2 weeks due to pain. Patient is now limited by functional impairments (see PT problem list below) and requires Mod assist for bed mobility with cues and assist to maintain precautions and min assist for transfers and gait with RW. Patient was able to ambulate 50 feet with RW and min assist. Patient instructed in exercise to facilitate circulation to manage edema and reduce risk of DVT. Patient will benefit from continued skilled PT interventions to address impairments and progress towards PLOF. Acute PT will follow to progress mobility and stair training in preparation for safe discharge home.        Recommendations for follow up therapy are one component of a multi-disciplinary discharge planning process, led by the attending physician.  Recommendations may be updated based on patient status, additional functional criteria and insurance authorization.  Follow Up Recommendations Follow physician's recommendations for discharge plan and follow up therapies    Assistance Recommended at Discharge Frequent or constant Supervision/Assistance  Patient can return home with the following  A little help with walking and/or transfers;A lot of help with bathing/dressing/bathroom;Assistance with cooking/housework;Direct supervision/assist for medications management;Assist for transportation;Help with stairs or ramp for entrance    Equipment Recommendations None recommended by PT  Recommendations for Other  Services       Functional Status Assessment Patient has had a recent decline in their functional status and demonstrates the ability to make significant improvements in function in a reasonable and predictable amount of time.     Precautions / Restrictions Precautions Precautions: Fall Precaution Comments: WBAT RLE, posterior and anterior hip precautions x6 weeks; Prevena wound VAC to stay on for 1 week Restrictions Weight Bearing Restrictions: No Other Position/Activity Restrictions: WBAT      Mobility  Bed Mobility Overal bed mobility: Needs Assistance Bed Mobility: Supine to Sit     Supine to sit: Mod assist, HOB elevated     General bed mobility comments: Assist and cues needed to maintain posterio/anterior hip precautions and to mobilize Rt LE to EOB.    Transfers Overall transfer level: Needs assistance Equipment used: Rolling walker (2 wheels) Transfers: Sit to/from Stand Sit to Stand: Min assist, From elevated surface           General transfer comment: Min assist to steady power up and pt complete SL rise on Lt LE while maintaining Rt kne extension. Patient required assist to support Rt LE whil sitting on commode.    Ambulation/Gait Ambulation/Gait assistance: Min assist Gait Distance (Feet): 50 Feet Assistive device: Rolling walker (2 wheels) Gait Pattern/deviations: Step-to pattern, Decreased stride length, Decreased step length - right, Decreased weight shift to right Gait velocity: decr     General Gait Details: Cues for safe step pattern to lead with Rt LE to prevent hip extension past neutral on Rt. Min assist to manage RW and pt overall cautious with slow gait.  Stairs            Wheelchair Mobility    Modified Rankin (Stroke Patients Only)  Balance Overall balance assessment: Needs assistance Sitting-balance support: Feet supported, Bilateral upper extremity supported Sitting balance-Leahy Scale: Fair     Standing balance  support: Reliant on assistive device for balance, During functional activity, Bilateral upper extremity supported Standing balance-Leahy Scale: Poor                               Pertinent Vitals/Pain Pain Assessment Pain Assessment: 0-10 Pain Score: 4  Pain Location: Rt LE Pain Descriptors / Indicators: Aching, Discomfort Pain Intervention(s): Limited activity within patient's tolerance, Monitored during session, Repositioned, Ice applied    Home Living Family/patient expects to be discharged to:: Private residence Living Arrangements: Spouse/significant other;Parent;Children (parent lives in basement apartment) Available Help at Discharge: Family Type of Home: House Home Access: Stairs to enter Entrance Stairs-Rails: Right Entrance Stairs-Number of Steps: 2 Alternate Level Stairs-Number of Steps: 12 Home Layout: Two level;Bed/bath upstairs (recliner upstairs) Home Equipment: Rolling Walker (2 wheels);Cane - single point;BSC/3in1 Additional Comments: downstairs bathroom has walk in shower that her dad uses, she can use    Prior Function Prior Level of Function : Needs assist             Mobility Comments: since [place] In February pt progresed independence. pt has been usin [facility] for longer community mobility and sometimes for stairs. he noticed pain around the end of May and start of June after twisting injury in the garden.(pain with wb worsened 1-2 wks ago ADLs Comments: still bathing and dressing self day before admission     Hand Dominance   Dominant Hand: Right    Extremity/Trunk Assessment   Upper Extremity Assessment Upper Extremity Assessment: Overall WFL for tasks assessed    Lower Extremity Assessment Lower Extremity Assessment: RLE deficits/detail;Overall WFL for tasks assessed RLE Deficits / Details: good quad activation and dorsi/plantar flexion RLE:  (limited due to precautions for hip) RLE Sensation: WNL RLE Coordination: WNL    Cervical /  Trunk Assessment Cervical / Trunk Assessment: Normal  Communication   Communication: No difficulties  Cognition Arousal/Alertness: Awake/alert Behavior During Therapy: WFL for tasks assessed/performed Overall Cognitive Status: Within Functional Limits for tasks assessed                                          General Comments      Exercises Total Joint Exercises Ankle Circles/Pumps: AROM, Both, 20 reps, Seated   Assessment/Plan    PT Assessment Patient needs continued PT services  PT Problem List Decreased strength;Decreased range of motion;Decreased activity tolerance;Decreased balance;Decreased mobility;Decreased knowledge of use of DME;Decreased safety awareness;Decreased knowledge of precautions;Pain       PT Treatment Interventions DME instruction;Gait training;Stair training;Functional mobility training;Therapeutic activities;Therapeutic exercise;Balance training;Patient/family education    PT Goals (Current goals can be found in the Care Plan section)  Acute Rehab PT Goals Patient Stated Goal: recover and maintain precuations PT Goal Formulation: With patient Time For Goal Achievement: 10/26/21 Potential to Achieve Goals: Good    Frequency 7X/week     Co-evaluation               AM-PAC PT "6 Clicks" Mobility  Outcome Measure Help needed turning from your back to your side while in a flat bed without using bedrails?: A Little Help needed moving from lying on your back to sitting on the side of a flat bed  without using bedrails?: A Lot Help needed moving to and from a bed to a chair (including a wheelchair)?: A Little Help needed standing up from a chair using your arms (e.g., wheelchair or bedside chair)?: A Little Help needed to walk in hospital room?: A Little Help needed climbing 3-5 steps with a railing? : A Lot 6 Click Score: 16    End of Session Equipment Utilized During Treatment: Gait belt Activity Tolerance: Patient tolerated  treatment well Patient left: in chair;with call bell/phone within reach;with chair alarm set Nurse Communication: Mobility status;Precautions PT Visit Diagnosis: Unsteadiness on feet (R26.81);Muscle weakness (generalized) (M62.81);Difficulty in walking, not elsewhere classified (R26.2)    Time: [id number] PT Time Calculation (min) (ACUTE ONLY): 46 min   Charges:   PT Evaluation $PT Eval Low Complexity: 1 Low PT Treatments $Gait Training: 8-22 mins $Therapeutic Activity: 8-22 mins        Verner Mould, DPT Acute Rehabilitation Services Office [phone] Pager [phone]  10/19/21 10:39 AM

## 2021-10-19 NOTE — TOC Transition Note (Signed)
Transition of Care [facility]) - CM/SW Discharge Note   Patient Details  Name: Joyce Bradford MRN: [id number] Date of Birth: Mar 16, 1966  [facility] CM/SW Contact:  Lennart Pall, LCSW Phone Number: 10/19/2021, 10:10 AM   Clinical Narrative:    Met with pt and spouse and confirming she has all needed DME at home.  Pt uncertain of therapy follow up plan and states she would like to have OPPT at [facility] as she did with prior surgery.  Have alerted MD who will have his office case manager follow up/ set up.  No further TOC needs.   Final next level of care: OP Rehab (vs. HEP) Barriers to Discharge: No Barriers Identified   Patient Goals and CMS Choice Patient states their goals for this hospitalization and ongoing recovery are:: return home      Discharge Placement                       Discharge Plan and Services                DME Arranged: N/A DME Agency: NA                  Social Determinants of Health (SDOH) Interventions     Readmission Risk Interventions    10/19/2021   10:09 AM  Readmission Risk Prevention Plan  Post Dischage Appt Complete  Medication Screening Complete  Transportation Screening Complete

## 2021-10-19 NOTE — Discharge Instructions (Signed)

## 2021-10-19 NOTE — Progress Notes (Signed)
     Subjective:  Patient doing well this morning. Pain well controlled overnight. Slept relatively well. Denies distal n/t. [place] to work with PT today. Discussed intraop findings and post op plan. Intraop cxs NGTD.  Objective:   VITALS:   Vitals:   10/18/21 1821 10/18/21 2026 10/19/21 0135 10/19/21 0624  BP: (!) 145/83 134/73 116/64 126/70  Pulse: 77 70 68 83  Resp: 15 18 18 18   Temp: (!) 97.4 F (36.3 C) 98 F (36.7 C) 98.2 F (36.8 C) 98.2 F (36.8 C)  TempSrc:  Oral Oral Oral  SpO2: 100% 100% 99% 100%  Weight:      Height:        Sensation intact distally Intact pulses distally Dorsiflexion/Plantar flexion intact Incision: prevena vac dressing holding suction no output in cannister Compartment soft   Lab Results  Component Value Date   WBC 11.5 (H) 10/19/2021   HGB 11.1 (L) 10/19/2021   HCT 34.4 (L) 10/19/2021   MCV 86.6 10/19/2021   PLT 251 10/19/2021   BMET    Component Value Date/Time   NA 140 10/19/2021 0336   K 4.0 10/19/2021 0336   CL 105 10/19/2021 0336   CO2 26 10/19/2021 0336   GLUCOSE 129 (H) 10/19/2021 0336   BUN 14 10/19/2021 0336   CREATININE 0.76 10/19/2021 0336   CALCIUM 9.4 10/19/2021 0336   GFRNONAA >60 10/19/2021 0336      Xray: post op xrays R hip demonstrate THA components in good position without adverse features, cortically irregularity on anterior femoral cortex not concerning for fracture  Assessment/Plan: 1 Day Post-Op   Principal Problem:   Hip instability, right  S/p R revision THA for anterior instability 6/13  Post op recs: WB: WBAT RLE, posterior and anterior hip precautions x6 weeks Abx: ancef while in house, discharge with 7 days cefadroxil 500 twice daily, follow-up Intra-Op cultures. Imaging: PACU pelvis Xray Dressing: Prevena wound VAC to stay on for 1 week. DVT prophylaxis: Aspirin 81BID starting POD1 Follow up: 1 week after surgery for a wound check with Dr. 7/13 at [facility].   Address: [address], [place], [place]  Office Phone: [phone]   [phone] 10/19/2021, 6:44 AM   10/21/2021, MD  Contact information:   [phone] 7am-5pm epic message Dr. BJSEGBTD, or call office for patient follow up: [phone] After hours and holidays please check [email or web address] for group call information for [facility]

## 2021-10-19 NOTE — Progress Notes (Signed)
Physical Therapy Treatment Patient Details Name: Joyce Bradford MRN: [id number] DOB: [birth year] Today's Date: 10/19/2021   History of Present Illness Patient is 56 y.o. female s/p Rt revision of THA for anterior hip instability on 10/18/21. Pt underwent initial Rt THA on 06/09/21 PMH significant for anxiety, depression, OA, asthma, Bil TKA.    PT Comments    Pt is progressing well with therapy. She ambulated ~70 ft in hallway with Min assist, RW, and chair follow for safety. She completed 3 exercises from HEP. Currently pt requires Min assist for transfers and balance. Her pain appears controlled during therapy. She will benefit from continued skilled therapy to progress independence and review hip precautions. Will progress as able.   Recommendations for follow up therapy are one component of a multi-disciplinary discharge planning process, led by the attending physician.  Recommendations may be updated based on patient status, additional functional criteria and insurance authorization.  Follow Up Recommendations  Follow physician's recommendations for discharge plan and follow up therapies     Assistance Recommended at Discharge Frequent or constant Supervision/Assistance  Patient can return home with the following A little help with walking and/or transfers;A lot of help with bathing/dressing/bathroom;Assistance with cooking/housework;Direct supervision/assist for medications management;Assist for transportation;Help with stairs or ramp for entrance   Equipment Recommendations  None recommended by PT    Recommendations for Other Services       Precautions / Restrictions Precautions Precautions: Fall Precaution Comments: WBAT RLE, posterior and anterior hip precautions x6 weeks; Prevena wound VAC to stay on for 1 week Restrictions Weight Bearing Restrictions: No Other Position/Activity Restrictions: WBAT     Mobility  Bed Mobility               General bed mobility  comments: pt OOB in recliner    Transfers Overall transfer level: Needs assistance Equipment used: Rolling walker (2 wheels) Transfers: Sit to/from Stand Sit to Stand: Min assist           General transfer comment: Pt demonstrated good carryover for standing with leg straight in front when moving sit<>stand.    Ambulation/Gait Ambulation/Gait assistance: Min assist Gait Distance (Feet): 70 Feet Assistive device: Rolling walker (2 wheels) Gait Pattern/deviations: Step-to pattern, Decreased stride length, Decreased step length - right, Decreased weight shift to right Gait velocity: decr     General Gait Details: Pt demonstrated good carryover for walker managment and step-to pattern leading with Rt to avoid hip extension.  Good carryover for turns to maintain hip precautions. Pt reported feeling overheated/hot midway through ambulation, denied dizziness/lightheadedness.   Stairs             Wheelchair Mobility    Modified Rankin (Stroke Patients Only)       Balance Overall balance assessment: Needs assistance Sitting-balance support: Feet supported, Bilateral upper extremity supported Sitting balance-Leahy Scale: Fair Sitting balance - Comments: Pt needs both arm support to follow protocol, no 90 degree hip flexion.   Standing balance support: Reliant on assistive device for balance, During functional activity, Bilateral upper extremity supported Standing balance-Leahy Scale: Fair Standing balance comment: Pt needed Bil UE support to maintain balance.                            Cognition Arousal/Alertness: Awake/alert Behavior During Therapy: WFL for tasks assessed/performed Overall Cognitive Status: Within Functional Limits for tasks assessed  Exercises Total Joint Exercises Ankle Circles/Pumps: AROM, Both, 20 reps, Seated Quad Sets: AROM, 10 reps, Both, Seated Heel Slides: AAROM, Right,  5 reps, Seated    General Comments        Pertinent Vitals/Pain Pain Assessment Pain Assessment: 0-10 Pain Score: 4  Pain Location: Rt LE Pain Descriptors / Indicators: Aching, Discomfort Pain Intervention(s): Limited activity within patient's tolerance, Monitored during session, Premedicated before session, Ice applied, Repositioned    Home Living                          Prior Function            PT Goals (current goals can now be found in the care plan section) Acute Rehab PT Goals Patient Stated Goal: recover and maintain precuations PT Goal Formulation: With patient Time For Goal Achievement: 10/26/21 Potential to Achieve Goals: Good Progress towards PT goals: Progressing toward goals    Frequency    7X/week      PT Plan      Co-evaluation              AM-PAC PT "6 Clicks" Mobility   Outcome Measure  Help needed turning from your back to your side while in a flat bed without using bedrails?: A Little Help needed moving from lying on your back to sitting on the side of a flat bed without using bedrails?: A Lot Help needed moving to and from a bed to a chair (including a wheelchair)?: A Little Help needed standing up from a chair using your arms (e.g., wheelchair or bedside chair)?: A Little Help needed to walk in hospital room?: A Little Help needed climbing 3-5 steps with a railing? : A Lot 6 Click Score: 16    End of Session Equipment Utilized During Treatment: Gait belt Activity Tolerance: Patient tolerated treatment well Patient left: in chair;with call bell/phone within reach;with chair alarm set Nurse Communication: Mobility status;Precautions PT Visit Diagnosis: Unsteadiness on feet (R26.81);Muscle weakness (generalized) (M62.81);Difficulty in walking, not elsewhere classified (R26.2)     Time: 1410-1445 PT Time Calculation (min) (ACUTE ONLY): 35 min  Charges:  $Gait Training: 8-22 mins $Therapeutic Exercise: 8-22 mins                      Lenn Cal, SPT Acute Rehab [facility] 10/19/2021, 4:44 PM

## 2021-10-20 DIAGNOSIS — T84090A Other mechanical complication of internal right hip prosthesis, initial encounter: Secondary | ICD-10-CM | POA: Diagnosis not present

## 2021-10-20 LAB — CBC
HCT: 32 % — ABNORMAL LOW (ref 36.0–46.0)
Hemoglobin: 9.8 g/dL — ABNORMAL LOW (ref 12.0–15.0)
MCH: 28.2 pg (ref 26.0–34.0)
MCHC: 30.6 g/dL (ref 30.0–36.0)
MCV: 92 fL (ref 80.0–100.0)
Platelets: 210 10*3/uL (ref 150–400)
RBC: 3.48 MIL/uL — ABNORMAL LOW (ref 3.87–5.11)
RDW: 13.9 % (ref 11.5–15.5)
WBC: 8.4 10*3/uL (ref 4.0–10.5)
nRBC: 0 % (ref 0.0–0.2)

## 2021-10-20 MED ORDER — OXYCODONE HCL 5 MG PO TABS: 5.0000 mg | ORAL_TABLET | ORAL | 0 refills | Status: AC | PRN

## 2021-10-20 MED ORDER — ASPIRIN 81 MG PO TBEC
81.0000 mg | DELAYED_RELEASE_TABLET | Freq: Two times a day (BID) | ORAL | 0 refills | Status: AC
Start: 2021-10-21 — End: 2021-11-18

## 2021-10-20 MED ORDER — METHOCARBAMOL 500 MG PO TABS: 500.0000 mg | ORAL_TABLET | Freq: Three times a day (TID) | ORAL | 0 refills | Status: AC | PRN

## 2021-10-20 MED ORDER — ACETAMINOPHEN 500 MG PO TABS: 1000.0000 mg | ORAL_TABLET | Freq: Three times a day (TID) | ORAL | 0 refills | Status: AC | PRN

## 2021-10-20 MED ORDER — ONDANSETRON HCL 4 MG PO TABS: 4.0000 mg | ORAL_TABLET | Freq: Three times a day (TID) | ORAL | 0 refills | Status: AC | PRN

## 2021-10-20 MED ORDER — CEFADROXIL 500 MG PO CAPS: 500.0000 mg | ORAL_CAPSULE | Freq: Two times a day (BID) | ORAL | 0 refills | Status: AC

## 2021-10-20 NOTE — Progress Notes (Signed)
Physical Therapy Treatment Patient Details Name: Joyce Bradford MRN: [id number] DOB: November 19, 1965 Today's Date: 10/20/2021   History of Present Illness Patient is 56 y.o. female s/p Rt revision of THA for anterior hip instability on 10/18/21. Pt underwent initial Rt THA on 06/09/21 PMH significant for anxiety, depression, OA, asthma, Bil TKA.    PT Comments    Pt is progressing well with therapy. Pt ambulated 100 ft  and completed 6 steps using RW with Min guard. Currently pt requires Min guard-Min assist for transfers and Min assist for bed mobility. Pt needs continued reinforcement with following anterior and posterior hip precautions and will benefit from continued skilled therapy. Pt is mobilizing safely and is ready for DC home with assist from husband and daughter. Pt plans to follow up with surgeon for wound VAC removal and follow up therapy.   Recommendations for follow up therapy are one component of a multi-disciplinary discharge planning process, led by the attending physician.  Recommendations may be updated based on patient status, additional functional criteria and insurance authorization.  Follow Up Recommendations  Follow physician's recommendations for discharge plan and follow up therapies     Assistance Recommended at Discharge Frequent or constant Supervision/Assistance  Patient can return home with the following A little help with walking and/or transfers;A lot of help with bathing/dressing/bathroom;Assistance with cooking/housework;Direct supervision/assist for medications management;Assist for transportation;Help with stairs or ramp for entrance   Equipment Recommendations  None recommended by PT    Recommendations for Other Services       Precautions / Restrictions Precautions Precautions: Fall Precaution Comments: WBAT RLE, posterior and anterior hip precautions x6 weeks; Prevena wound VAC to stay on for 1 week Restrictions Weight Bearing Restrictions: No Other  Position/Activity Restrictions: WBAT     Mobility  Bed Mobility Overal bed mobility: Needs Assistance Bed Mobility: Supine to Sit     Supine to sit: Min assist, HOB elevated     General bed mobility comments: pt able to use bil UE to scoot and able to bring Lt LE off EOB. Assist to move Rt LE and maintain neutral hip position.    Transfers Overall transfer level: Needs assistance Equipment used: Rolling walker (2 wheels) Transfers: Sit to/from Stand Sit to Stand: Min assist, Min guard           General transfer comment: min guard from EOB and BSC. pt using bil UE to power up and completing SL rise on Lt LE keeping Rt LE extended to maintain hip precautions.    Ambulation/Gait Ambulation/Gait assistance: Min guard Gait Distance (Feet): 100 Feet Assistive device: Rolling walker (2 wheels) Gait Pattern/deviations: Step-to pattern, Decreased stride length, Decreased step length - right, Decreased weight shift to right Gait velocity: decr     General Gait Details: pt demonstrates good carryover for safe step to pattern leading with Rt LE to maintain hip precautions. Min guarding for safety. pt maintained safe position to walker. pt keeping Rt foot off ground and pivoting on Lt for turns.   Stairs Stairs: Yes Stairs assistance: Min guard Stair Management: One rail Right, Two rails, Step to pattern, Forwards Number of Stairs: 6 General stair comments: cues for sequencing "up with good, down with bad" pt completed with bil rails and single Rt rail and SPC. no LOB. verablized safe guarding for husband to provide.   Wheelchair Mobility    Modified Rankin (Stroke Patients Only)       Balance Overall balance assessment: Needs assistance Sitting-balance support: Feet supported, Bilateral upper  extremity supported Sitting balance-Leahy Scale: Fair Sitting balance - Comments: Pt needs both arm support to follow protocol, no 90 degree hip flexion.   Standing balance  support: Reliant on assistive device for balance, During functional activity, Bilateral upper extremity supported Standing balance-Leahy Scale: Fair Standing balance comment: Pt needed Bil UE support to maintain balance.                            Cognition Arousal/Alertness: Awake/alert Behavior During Therapy: WFL for tasks assessed/performed Overall Cognitive Status: Within Functional Limits for tasks assessed                                          Exercises Total Joint Exercises Ankle Circles/Pumps: AROM, Both, 20 reps, Seated Quad Sets: AROM, Both, Seated, 5 reps Heel Slides: AAROM, Right, 5 reps, Seated Long Arc Quad: AROM, Both, Seated, 5 reps    General Comments        Pertinent Vitals/Pain Pain Assessment Pain Assessment: 0-10 Pain Location: Rt LE Pain Descriptors / Indicators: Aching, Discomfort Pain Intervention(s): Limited activity within patient's tolerance, Monitored during session, Premedicated before session, Repositioned, Ice applied    Home Living                          Prior Function            PT Goals (current goals can now be found in the care plan section) Acute Rehab PT Goals Patient Stated Goal: recover and maintain precuations PT Goal Formulation: With patient Time For Goal Achievement: 10/26/21 Potential to Achieve Goals: Good Progress towards PT goals: Progressing toward goals    Frequency    7X/week      PT Plan Current plan remains appropriate    Co-evaluation              AM-PAC PT "6 Clicks" Mobility   Outcome Measure  Help needed turning from your back to your side while in a flat bed without using bedrails?: A Little Help needed moving from lying on your back to sitting on the side of a flat bed without using bedrails?: A Little Help needed moving to and from a bed to a chair (including a wheelchair)?: A Little Help needed standing up from a chair using your arms (e.g.,  wheelchair or bedside chair)?: A Little Help needed to walk in hospital room?: A Little Help needed climbing 3-5 steps with a railing? : A Little 6 Click Score: 18    End of Session Equipment Utilized During Treatment: Gait belt Activity Tolerance: Patient tolerated treatment well Patient left: in chair;with call bell/phone within reach;with chair alarm set Nurse Communication: Mobility status;Precautions PT Visit Diagnosis: Unsteadiness on feet (R26.81);Muscle weakness (generalized) (M62.81);Difficulty in walking, not elsewhere classified (R26.2)     Time: 0626-9485 PT Time Calculation (min) (ACUTE ONLY): 34 min  Charges:  $Gait Training: 8-22 mins $Therapeutic Exercise: 8-22 mins                     Lenn Cal, SPT Acute Rehab [facility] 10/20/2021, 12:27 PM

## 2021-10-20 NOTE — Progress Notes (Signed)
     Subjective:  Patient doing well this morning. Worked well with PT yseterday. Had some issues with muscle spasms overnight which has been common with her prior joint surgeries. Has responded well to muscle relaxants. Denies dsital n/t. NGTD.  Objective:   VITALS:   Vitals:   10/19/21 0910 10/19/21 1358 10/19/21 2115 10/20/21 0629  BP: (!) 115/57 113/68 124/61 (!) 110/59  Pulse: 78 75 76 89  Resp: 18 18 18 18   Temp: 98.3 F (36.8 C) 98.2 F (36.8 C) 98.3 F (36.8 C) 98.4 F (36.9 C)  TempSrc: Oral Oral Oral Oral  SpO2: 100% 100% 98% 97%  Weight:      Height:        Sensation intact distally Intact pulses distally Dorsiflexion/Plantar flexion intact Incision: prevena vac dressing holding suction no output in cannister Compartment soft   Lab Results  Component Value Date   WBC 8.4 10/20/2021   HGB 9.8 (L) 10/20/2021   HCT 32.0 (L) 10/20/2021   MCV 92.0 10/20/2021   PLT 210 10/20/2021   BMET    Component Value Date/Time   NA 140 10/19/2021 0336   K 4.0 10/19/2021 0336   CL 105 10/19/2021 0336   CO2 26 10/19/2021 0336   GLUCOSE 129 (H) 10/19/2021 0336   BUN 14 10/19/2021 0336   CREATININE 0.76 10/19/2021 0336   CALCIUM 9.4 10/19/2021 0336   GFRNONAA >60 10/19/2021 0336      Xray: post op xrays R hip demonstrate THA components in good position without adverse features, cortically irregularity on anterior femoral cortex not concerning for fracture  Assessment/Plan: 2 Days Post-Op   Principal Problem:   Hip instability, right  S/p R revision THA for anterior instability 6/13  Post op recs: WB: WBAT RLE, posterior and anterior hip precautions x6 weeks Abx: ancef while in house, discharge with 7 days cefadroxil 500 twice daily, follow-up Intra-Op cultures. Imaging: PACU pelvis Xray Dressing: Prevena wound VAC to stay on for 1 week. DVT prophylaxis: Aspirin 81BID starting POD1 Follow up: 1 week after surgery for a wound check with Dr. 7/13 at  [facility].  Address: [address], [place], [place]  Office Phone: [phone]   [phone] 10/20/2021, 7:07 AM   10/22/2021, MD  Contact information:   [phone] 7am-5pm epic message Dr. CHYIFOYD, or call office for patient follow up: [phone] After hours and holidays please check [email or web address] for group call information for [facility]

## 2021-10-21 NOTE — Discharge Summary (Signed)
Physician Discharge Summary  Patient ID: Joyce Bradford MRN: [id number] DOB/AGE: 1965/08/04 56 y.o.  Admit date: 10/18/2021 Discharge date: 10/21/2021  Admission Diagnoses:  Hip instability, right  Discharge Diagnoses:  Principal Problem:   Hip instability, right   Past Medical History:  Diagnosis Date   Anxiety    Arthritis    Asthma    Chicken pox    Depression    Family history of adverse reaction to anesthesia    Mom hard time waking   Frequent headaches    Hay fever    Positive PPD     Surgeries: Procedure(s): TOTAL HIP REVISION Acetabular component only on 10/18/2021   Consultants (if any): Treatment Team:  Marchia Bond, MD  Discharged Condition: Improved  Hospital Course: Joyce Bradford is an 56 y.o. female who was admitted 10/18/2021 with a diagnosis of Hip instability, right and went to the operating room on 10/18/2021 and underwent the above named procedures.    She was given perioperative antibiotics:  Anti-infectives (From admission, onward)    Start     Dose/Rate Route Frequency Ordered Stop   10/20/21 0000  cefadroxil (DURICEF) 500 MG capsule        500 mg Oral 2 times daily 10/20/21 0709 10/27/21 2359   10/18/21 2000  ceFAZolin (ANCEF) IVPB 2g/100 mL premix  Status:  Discontinued        2 g 200 mL/hr over 30 Minutes Intravenous Every 8 hours 10/18/21 1813 10/20/21 1608   10/18/21 0945  ceFAZolin (ANCEF) IVPB 2g/100 mL premix        2 g 200 mL/hr over 30 Minutes Intravenous On call to O.R. 10/18/21 [id number] 10/18/21 1359     .  She was given sequential compression devices, early ambulation, and aspirin for DVT prophylaxis.  She benefited maximally from the hospital stay and there were no complications.    Recent vital signs:  Vitals:   10/19/21 2115 10/20/21 0629  BP: 124/61 (!) 110/59  Pulse: 76 89  Resp: 18 18  Temp: 98.3 F (36.8 C) 98.4 F (36.9 C)  SpO2: 98% 97%    Recent laboratory studies:  Lab Results  Component Value Date    HGB 9.8 (L) 10/20/2021   HGB 11.1 (L) 10/19/2021   Lab Results  Component Value Date   WBC 8.4 10/20/2021   PLT 210 10/20/2021   No results found for: "INR" Lab Results  Component Value Date   NA 140 10/19/2021   K 4.0 10/19/2021   CL 105 10/19/2021   CO2 26 10/19/2021   BUN 14 10/19/2021   CREATININE 0.76 10/19/2021   GLUCOSE 129 (H) 10/19/2021    Discharge Medications:   Allergies as of 10/20/2021   No Known Allergies      Medication List     TAKE these medications    acetaminophen 500 MG tablet Commonly known as: TYLENOL Take 2 tablets (1,000 mg total) by mouth every 8 (eight) hours as needed.   albuterol 108 (90 Base) MCG/ACT inhaler Commonly known as: VENTOLIN HFA Inhale 2 puffs into the lungs every 6 (six) hours as needed for wheezing or shortness of breath.   aspirin EC 81 MG tablet Take 1 tablet (81 mg total) by mouth 2 (two) times daily for 28 days. Swallow whole.   b complex vitamins capsule Take 1 capsule by mouth daily.   baclofen 10 MG tablet Commonly known as: LIORESAL Take 10 mg by mouth daily as needed for muscle spasms.   cefadroxil 500 MG  capsule Commonly known as: DURICEF Take 1 capsule (500 mg total) by mouth 2 (two) times daily for 7 days.   etodolac 400 MG tablet Commonly known as: LODINE Take 400 mg by mouth 2 (two) times daily.   methocarbamol 500 MG tablet Commonly known as: ROBAXIN Take 1 tablet (500 mg total) by mouth every 8 (eight) hours as needed for up to 10 days for muscle spasms.   ondansetron 4 MG tablet Commonly known as: Zofran Take 1 tablet (4 mg total) by mouth every 8 (eight) hours as needed for up to 14 days for nausea or vomiting.   oxyCODONE 5 MG immediate release tablet Commonly known as: Roxicodone Take 1 tablet (5 mg total) by mouth every 4 (four) hours as needed for up to 7 days for severe pain or moderate pain.   traMADol 50 MG tablet Commonly known as: ULTRAM Take 50 mg by mouth 2 (two) times  daily.   Vitamin D (Cholecalciferol) 25 MCG (1000 UT) Tabs Take 2,000 Units by mouth daily.        Diagnostic Studies: DG HIP UNILAT W OR W/O PELVIS 2-3 VIEWS RIGHT  Result Date: 10/18/2021 CLINICAL DATA:  Post RIGHT hip replacement, bedside images. EXAM: DG HIP (WITH OR WITHOUT PELVIS) 2-3V RIGHT COMPARISON:  Intraoperative fluoroscopic images of the same date. FINDINGS: Post RIGHT hip arthroplasty. Gas in the soft tissues about the RIGHT hip as expected following recent surgery. No definite acute or unexpected radiographic finding. Mild irregularity along the anterior cortex seen on the lateral projection, assessment limited by overlap. The distal aspect of the femoral component is incompletely evaluated. IMPRESSION: Post RIGHT hip arthroplasty, no definite acute finding. Mild irregularity along the anterior cortex seen on the lateral projection, assessment limited by overlap, the possibility of subtle periprosthetic fracture is considered. Additionally, the distal aspect of the femoral component is incompletely assessed. Consider repeat lateral radiograph when the patient is able for complete assessment. These results will be called to the ordering clinician or representative by the Radiologist Assistant, and communication documented in the PACS or [employer]. Electronically Signed   By: Zetta Bills M.D.   On: 10/18/2021 18:44   DG HIP PORT UNILAT WITH PELVIS 1V RIGHT  Result Date: 10/18/2021 CLINICAL DATA:  [id number] right hip prosthesis EXAM: DG HIP (WITH OR WITHOUT PELVIS) 1V PORT RIGHT COMPARISON:  None FINDINGS: Four intraoperative images of the right hip were obtained. There are post total right hip prosthetic changes seen and the prosthetic components are in satisfactory alignment. Visualized left hip joint shows mild degenerative changes. IMPRESSION: Status post right hip prosthesis and the prosthetic components are in satisfactory alignment. Electronically Signed   By: Frazier Richards  M.D.   On: 10/18/2021 15:53   DG C-Arm 1-60 Min-No Report  Result Date: 10/18/2021 Fluoroscopy was utilized by the requesting physician.  No radiographic interpretation.   DG C-Arm 1-60 Min-No Report  Result Date: 10/18/2021 Fluoroscopy was utilized by the requesting physician.  No radiographic interpretation.   DG C-Arm 1-60 Min-No Report  Result Date: 10/18/2021 Fluoroscopy was utilized by the requesting physician.  No radiographic interpretation.   MM 3D SCREEN BREAST BILATERAL  Result Date: 10/10/2021 CLINICAL DATA:  Screening. EXAM: DIGITAL SCREENING BILATERAL MAMMOGRAM WITH TOMOSYNTHESIS AND CAD TECHNIQUE: Bilateral screening digital craniocaudal and mediolateral oblique mammograms were obtained. Bilateral screening digital breast tomosynthesis was performed. The images were evaluated with computer-aided detection. COMPARISON:  Previous exam(s). ACR Breast Density Category b: There are scattered areas of fibroglandular density.  FINDINGS: There are no findings suspicious for malignancy. IMPRESSION: No mammographic evidence of malignancy. A result letter of this screening mammogram will be mailed directly to the patient. RECOMMENDATION: Screening mammogram in one year. (Code:SM-B-01Y) BI-RADS CATEGORY  1: Negative. Electronically Signed   By: Harmon Pier M.D.   On: 10/10/2021 12:36    Disposition: Discharge disposition: 01-Home or Self Care       Discharge Instructions     Call MD / Call 911   Complete by: As directed    If you experience chest pain or shortness of breath, CALL 911 and be transported to the hospital emergency room.  If you develope a fever above 101 F, pus (white drainage) or increased drainage or redness at the wound, or calf pain, call your surgeon's office.   Constipation Prevention   Complete by: As directed    Drink plenty of fluids.  Prune juice may be helpful.  You may use a stool softener, such as Colace (over the counter) 100 mg twice a day.  Use MiraLax  (over the counter) for constipation as needed.   Diet - low sodium heart healthy   Complete by: As directed    Follow the hip precautions as taught in Physical Therapy   Complete by: As directed    Increase activity slowly as tolerated   Complete by: As directed    Post-operative opioid taper instructions:   Complete by: As directed    POST-OPERATIVE OPIOID TAPER INSTRUCTIONS: It is important to wean off of your opioid medication as soon as possible. If you do not need pain medication after your surgery it is ok to stop day one. Opioids include: Codeine, Hydrocodone(Norco, Vicodin), Oxycodone(Percocet, oxycontin) and hydromorphone amongst others.  Long term and even short term use of opiods can cause: Increased pain response Dependence Constipation Depression Respiratory depression And more.  Withdrawal symptoms can include Flu like symptoms Nausea, vomiting And more Techniques to manage these symptoms Hydrate well Eat regular healthy meals Stay active Use relaxation techniques(deep breathing, meditating, yoga) Do Not substitute Alcohol to help with tapering If you have been on opioids for less than two weeks and do not have pain than it is ok to stop all together.  Plan to wean off of opioids This plan should start within one week post op of your joint replacement. Maintain the same interval or time between taking each dose and first decrease the dose.  Cut the total daily intake of opioids by one tablet each day Next start to increase the time between doses. The last dose that should be eliminated is the evening dose.           Follow-up Information     Joen Laura, MD Follow up in 1 week(s).   Specialty: Orthopedic Surgery Contact information: [address] [place] [address] [phone]                    Discharge Instructions      INSTRUCTIONS AFTER JOINT REPLACEMENT   Remove items at home which could result in a fall.  This includes throw rugs or furniture in walking pathways ICE to the affected joint every three hours while awake for 30 minutes at a time, for at least the first 3-5 days, and then as needed for pain and swelling.  Continue to use ice for pain and swelling. You may notice swelling that will progress down to the foot and ankle.  This is normal after  surgery.  Elevate your leg when you are not up walking on it.   Continue to use the breathing machine you got in the hospital (incentive spirometer) which will help keep your temperature down.  It is common for your temperature to cycle up and down following surgery, especially at night when you are not up moving around and exerting yourself.  The breathing machine keeps your lungs expanded and your temperature down.   DIET:  As you were doing prior to hospitalization, we recommend a well-balanced diet.  DRESSING / WOUND CARE / SHOWERING  Keep the surgical dressing until follow up.  The dressing is water proof, so you can shower without any extra covering.  IF THE DRESSING FALLS OFF or the wound gets wet inside, change the dressing with sterile gauze.  Please use good hand washing techniques before changing the dressing.  Do not use any lotions or creams on the incision until instructed by your surgeon.    ACTIVITY  Increase activity slowly as tolerated, but follow the weight bearing instructions below.   No driving for 6 weeks or until further direction given by your physician.  You cannot drive while taking narcotics.  No lifting or carrying greater than 10 lbs. until further directed by your surgeon. Avoid periods of inactivity such as sitting longer than an hour when not asleep. This helps prevent blood clots.  You may return to work once you are authorized by your doctor.     WEIGHT BEARING   Weight bearing as tolerated with assist device (walker, cane, etc) as directed, use it as long as suggested by your surgeon or therapist, typically at  least 4-6 weeks.   EXERCISES  Results after joint replacement surgery are often greatly improved when you follow the exercise, range of motion and muscle strengthening exercises prescribed by your doctor. Safety measures are also important to protect the joint from further injury. Any time any of these exercises cause you to have increased pain or swelling, decrease what you are doing until you are comfortable again and then slowly increase them. If you have problems or questions, call your caregiver or physical therapist for advice.   Rehabilitation is important following a joint replacement. After just a few days of immobilization, the muscles of the leg can become weakened and shrink (atrophy).  These exercises are designed to build up the tone and strength of the thigh and leg muscles and to improve motion. Often times heat used for twenty to thirty minutes before working out will loosen up your tissues and help with improving the range of motion but do not use heat for the first two weeks following surgery (sometimes heat can increase post-operative swelling).   These exercises can be done on a training (exercise) mat, on the floor, on a table or on a bed. Use whatever works the best and is most comfortable for you.    Use music or television while you are exercising so that the exercises are a pleasant break in your day. This will make your life better with the exercises acting as a break in your routine that you can look forward to.   Perform all exercises about fifteen times, three times per day or as directed.  You should exercise both the operative leg and the other leg as well.  Exercises include:   Quad Sets - Tighten up the muscle on the front of the thigh (Quad) and hold for 5-10 seconds.   Straight Leg Raises - With  your knee straight (if you were given a brace, keep it on), lift the leg to 60 degrees, hold for 3 seconds, and slowly lower the leg.  Perform this exercise against  resistance later as your leg gets stronger.  Leg Slides: Lying on your back, slowly slide your foot toward your buttocks, bending your knee up off the floor (only go as far as is comfortable). Then slowly slide your foot back down until your leg is flat on the floor again.  Angel Wings: Lying on your back spread your legs to the side as far apart as you can without causing discomfort.  Hamstring Strength:  Lying on your back, push your heel against the floor with your leg straight by tightening up the muscles of your buttocks.  Repeat, but this time bend your knee to a comfortable angle, and push your heel against the floor.  You may put a pillow under the heel to make it more comfortable if necessary.   A rehabilitation program following joint replacement surgery can speed recovery and prevent re-injury in the future due to weakened muscles. Contact your doctor or a physical therapist for more information on knee rehabilitation.    CONSTIPATION  Constipation is defined medically as fewer than three stools per week and severe constipation as less than one stool per week.  Even if you have a regular bowel pattern at home, your normal regimen is likely to be disrupted due to multiple reasons following surgery.  Combination of anesthesia, postoperative narcotics, change in appetite and fluid intake all can affect your bowels.   YOU MUST use at least one of the following options; they are listed in order of increasing strength to get the job done.  They are all available over the counter, and you may need to use some, POSSIBLY even all of these options:    Drink plenty of fluids (prune juice may be helpful) and high fiber foods Colace 100 mg by mouth twice a day  Senokot for constipation as directed and as needed Dulcolax (bisacodyl), take with full glass of water  Miralax (polyethylene glycol) once or twice a day as needed.  If you have tried all these things and are unable to have a bowel movement  in the first 3-4 days after surgery call either your surgeon or your primary doctor.    If you experience loose stools or diarrhea, hold the medications until you stool forms back up.  If your symptoms do not get better within 1 week or if they get worse, check with your doctor.  If you experience "the worst abdominal pain ever" or develop nausea or vomiting, please contact the office immediately for further recommendations for treatment.   ITCHING:  If you experience itching with your medications, try taking only a single pain pill, or even half a pain pill at a time.  You can also use Benadryl over the counter for itching or also to help with sleep.   TED HOSE STOCKINGS:  Use stockings on both legs until for at least 2 weeks or as directed by physician office. They may be removed at night for sleeping.  MEDICATIONS:  See your medication summary on the "After Visit Summary" that nursing will review with you.  You may have some home medications which will be placed on hold until you complete the course of blood thinner medication.  It is important for you to complete the blood thinner medication as prescribed.   Blood clot prevention (DVT Prophylaxis): After  surgery you are at an increased risk for a blood clot. you were prescribed a blood thinner, Aspirin 81mg , to be taken twice daily for a total of 4 weeks from surgery to help reduce your risk of getting a blood clot. This will help prevent a blood clot. Signs of a pulmonary embolus (blood clot in the lungs) include sudden short of breath, feeling lightheaded or dizzy, chest pain with a deep breath, rapid pulse rapid breathing. Signs of a blood clot in your arms or legs include new unexplained swelling and cramping, warm, red or darkened skin around the painful area. Please call the office or 911 right away if these signs or symptoms develop.  PRECAUTIONS:  If you experience chest pain or shortness of breath - call 911 immediately for transfer to the  hospital emergency department.   If you develop a fever greater that 101 F, purulent drainage from wound, increased redness or drainage from wound, foul odor from the wound/dressing, or calf pain - CONTACT YOUR SURGEON.                                                   FOLLOW-UP APPOINTMENTS:  If you do not already have a post-op appointment, please call the office for an appointment to be seen by your surgeon.  Guidelines for how soon to be seen are listed in your "After Visit Summary", but are typically between 2-3 weeks after surgery.   POST-OPERATIVE OPIOID TAPER INSTRUCTIONS: It is important to wean off of your opioid medication as soon as possible. If you do not need pain medication after your surgery it is ok to stop day one. Opioids include: Codeine, Hydrocodone(Norco, Vicodin), Oxycodone(Percocet, oxycontin) and hydromorphone amongst others.  Long term and even short term use of opiods can cause: Increased pain response Dependence Constipation Depression Respiratory depression And more.  Withdrawal symptoms can include Flu like symptoms Nausea, vomiting And more Techniques to manage these symptoms Hydrate well Eat regular healthy meals Stay active Use relaxation techniques(deep breathing, meditating, yoga) Do Not substitute Alcohol to help with tapering If you have been on opioids for less than two weeks and do not have pain than it is ok to stop all together.  Plan to wean off of opioids This plan should start within one week post op of your joint replacement. Maintain the same interval or time between taking each dose and first decrease the dose.  Cut the total daily intake of opioids by one tablet each day Next start to increase the time between doses. The last dose that should be eliminated is the evening dose.   MAKE SURE YOU:  Understand these instructions.  Get help right away if you are not doing well or get worse.    Thank you for letting us be a part of  your medical care team.  It is a privilege we respect greatly.  We hope these instructions will help you stay on track for a fast and full recovery!            Signed: Jadence Kinlaw A Damonica Chopra 10/21/2021, 6:45 AM

## 2021-10-23 LAB — AEROBIC/ANAEROBIC CULTURE W GRAM STAIN (SURGICAL/DEEP WOUND)
Culture: NO GROWTH
Culture: NO GROWTH
Gram Stain: NONE SEEN

## 2021-10-24 ENCOUNTER — Encounter (HOSPITAL_COMMUNITY): Payer: Self-pay | Admitting: Orthopedic Surgery

## 2023-03-08 IMAGING — DX DG HIP (WITH OR WITHOUT PELVIS) 2-3V*R*
2 series · 2 of 2 positions shown · non-contrast
Comparison: Intraoperative fluoroscopic images of the same date.

CLINICAL DATA: Post RIGHT hip replacement, bedside images.

EXAM:
DG HIP (WITH OR WITHOUT PELVIS) 2-3V RIGHT

[hip lat]
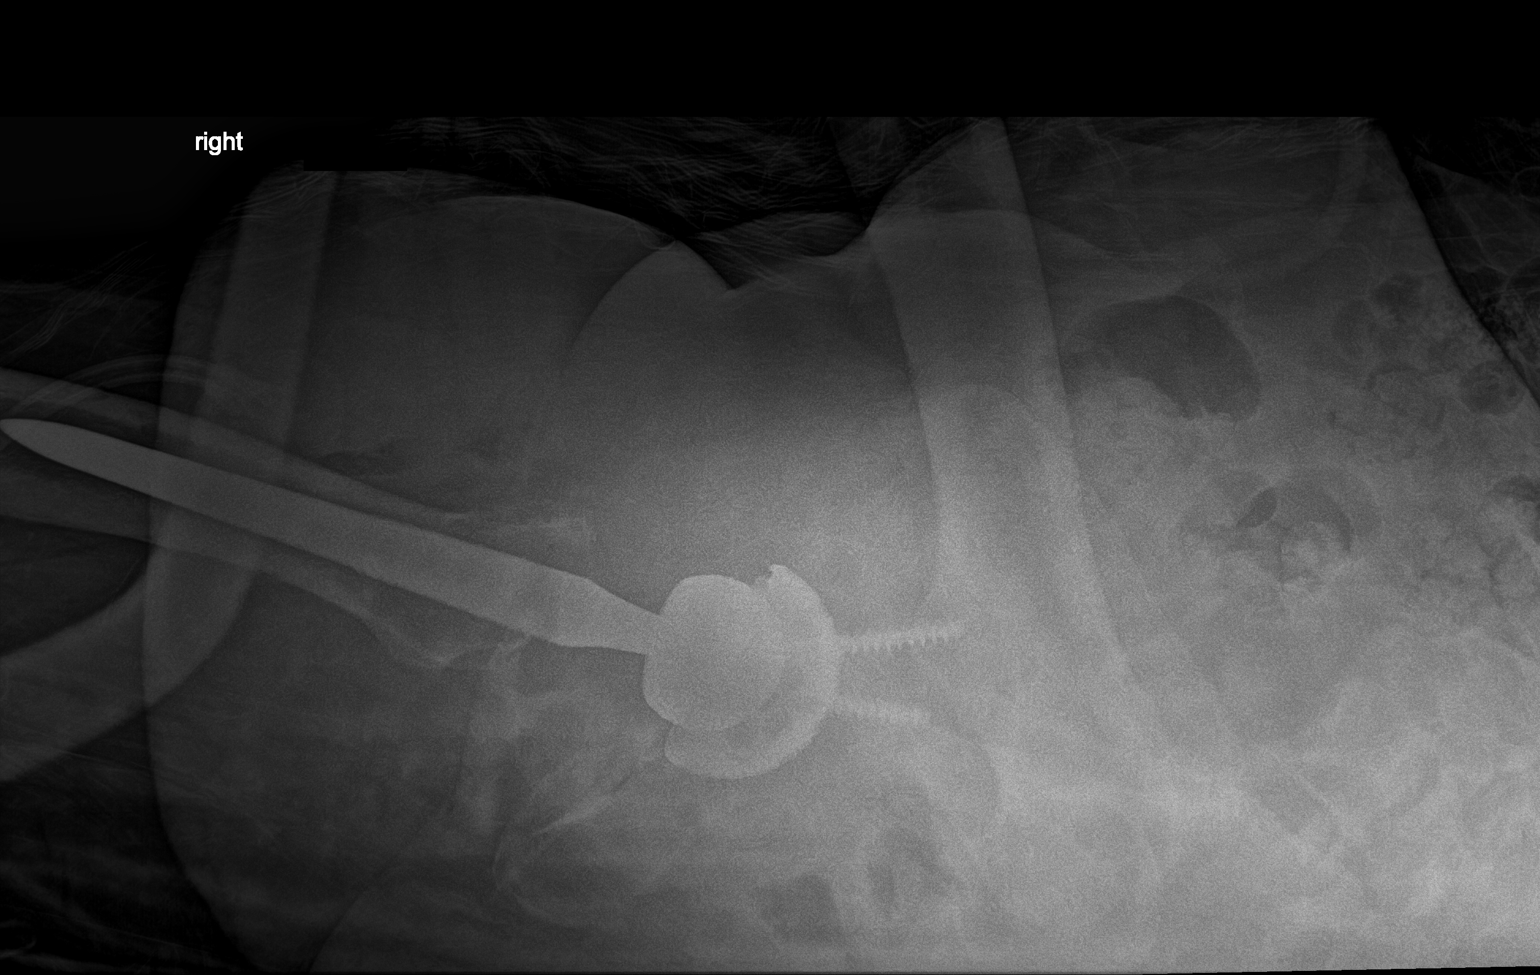

[pelvis ap]
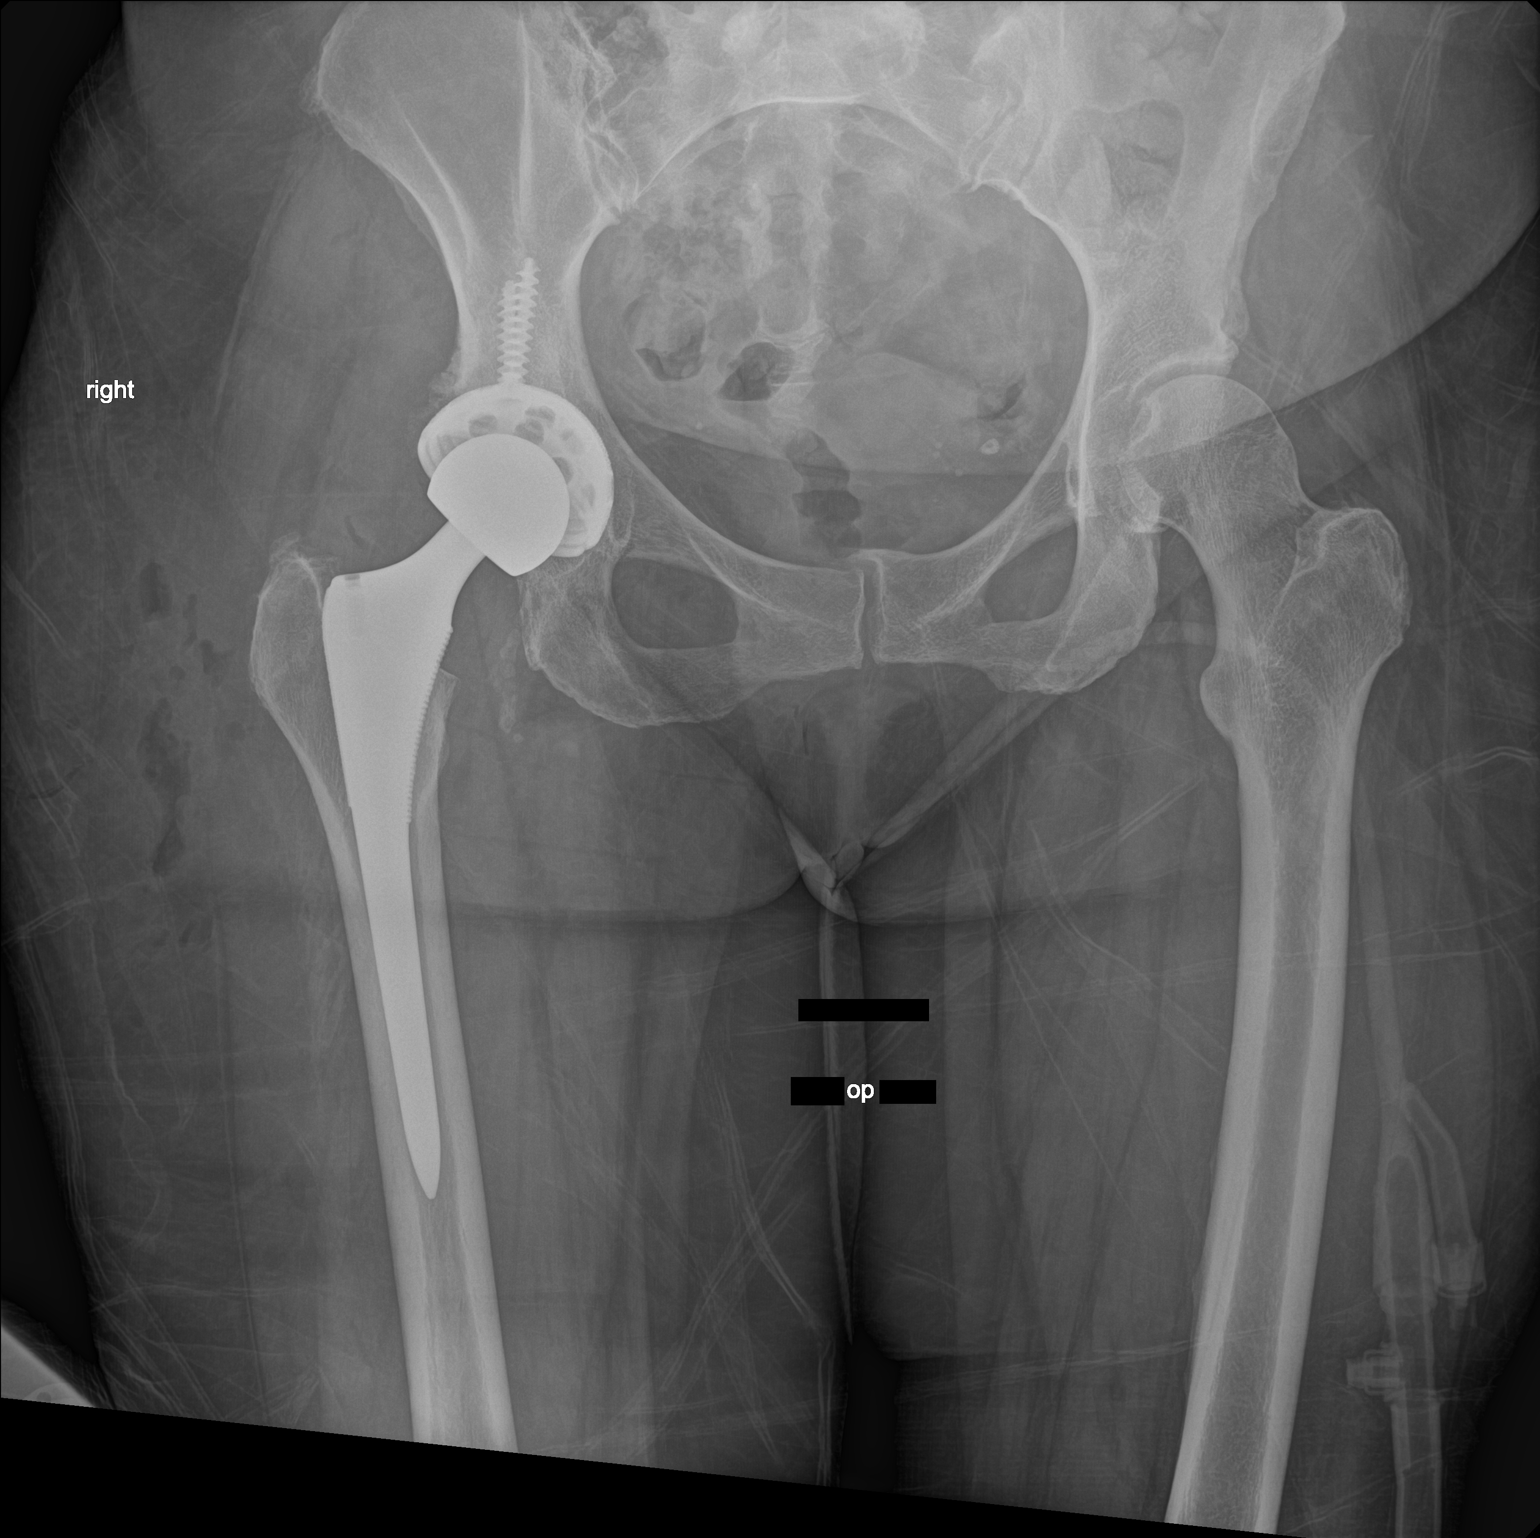

[2 of 2 positions shown; findings below may reference images not displayed]

FINDINGS: Post RIGHT hip arthroplasty. Gas in the soft tissues about the RIGHT
hip as expected following recent surgery. No definite acute or
unexpected radiographic finding. Mild irregularity along the
anterior cortex seen on the lateral projection, assessment limited
by overlap. The distal aspect of the femoral component is
incompletely evaluated.
IMPRESSION: Post RIGHT hip arthroplasty, no definite acute finding. Mild
irregularity along the anterior cortex seen on the lateral
projection, assessment limited by overlap, the possibility of subtle
periprosthetic fracture is considered. Additionally, the distal
aspect of the femoral component is incompletely assessed. Consider
repeat lateral radiograph when the patient is able for complete
assessment.

These results will be called to the ordering clinician or
representative by the Radiologist Assistant, and communication
documented in the PACS or [REDACTED].

## 2023-07-19 ENCOUNTER — Other Ambulatory Visit: Payer: Self-pay | Admitting: Physician Assistant

## 2023-07-19 DIAGNOSIS — Z1231 Encounter for screening mammogram for malignant neoplasm of breast: Secondary | ICD-10-CM

## 2023-07-27 ENCOUNTER — Ambulatory Visit
Admission: RE | Admit: 2023-07-27 | Discharge: 2023-07-27 | Disposition: A | Discharge status: Home / Self Care | Payer: Self-pay | Source: Ambulatory Visit | Attending: Physician Assistant | Admitting: Physician Assistant

## 2023-07-27 DIAGNOSIS — Z1231 Encounter for screening mammogram for malignant neoplasm of breast: Secondary | ICD-10-CM | POA: Insufficient documentation

## 2023-08-01 ENCOUNTER — Other Ambulatory Visit: Payer: Self-pay | Admitting: Physician Assistant

## 2023-08-01 DIAGNOSIS — R928 Other abnormal and inconclusive findings on diagnostic imaging of breast: Secondary | ICD-10-CM

## 2023-08-06 ENCOUNTER — Ambulatory Visit
Admission: RE | Admit: 2023-08-06 | Discharge: 2023-08-06 | Disposition: A | Discharge status: Home / Self Care | Source: Ambulatory Visit | Attending: Physician Assistant | Admitting: Physician Assistant

## 2023-08-06 DIAGNOSIS — R928 Other abnormal and inconclusive findings on diagnostic imaging of breast: Secondary | ICD-10-CM | POA: Diagnosis present
